# Patient Record
Sex: Male | Born: 1950 | Race: White | Hispanic: No | Marital: Married | Smoking: Current every day smoker
Health system: Southern US, Community
[De-identification: ages and names within clinical notes are randomized; demographics above are authoritative.]

## PROBLEM LIST (undated history)

## (undated) DIAGNOSIS — C801 Malignant (primary) neoplasm, unspecified: Secondary | ICD-10-CM

## (undated) DIAGNOSIS — I10 Essential (primary) hypertension: Secondary | ICD-10-CM

## (undated) DIAGNOSIS — I313 Pericardial effusion (noninflammatory): Secondary | ICD-10-CM

---

## 2002-04-22 ENCOUNTER — Emergency Department (HOSPITAL_COMMUNITY): Admission: EM | Admit: 2002-04-22 | Discharge: 2002-04-22 | Payer: Self-pay | Admitting: Emergency Medicine

## 2002-06-23 ENCOUNTER — Emergency Department (HOSPITAL_COMMUNITY): Admission: EM | Admit: 2002-06-23 | Discharge: 2002-06-23 | Payer: Self-pay | Admitting: Emergency Medicine

## 2010-06-11 ENCOUNTER — Telehealth: Payer: Self-pay | Admitting: *Deleted

## 2010-07-03 ENCOUNTER — Telehealth: Payer: Self-pay | Admitting: *Deleted

## 2010-07-03 NOTE — Telephone Encounter (Signed)
BP readings:  Sat: June 9, 147/90, Sun: 143/78, Mon:  155/88, Tues:  138/89, Wed:  140/96, Th:  155/82, Friday:  162/93.

## 2010-07-03 NOTE — Telephone Encounter (Signed)
Wrong Chart ..........Marland KitchenPlease disregard.

## 2010-07-07 NOTE — Telephone Encounter (Signed)
Debbie please contact [phone] and request to have this document removed from this patient's chart.  Thanks  [place]

## 2010-07-07 NOTE — Telephone Encounter (Signed)
Debbie, please contact [phone] to have this document removed from this(incorrect) patient's chart.  Thanks [place]

## 2012-12-06 ENCOUNTER — Ambulatory Visit: Payer: Self-pay | Admitting: Radiation Oncology

## 2012-12-18 ENCOUNTER — Ambulatory Visit: Payer: Self-pay | Admitting: Radiation Oncology

## 2013-01-08 LAB — CBC CANCER CENTER
Basophil #: 0.1 x10 3/mm (ref 0.0–0.1)
Basophil %: 1.3 %
Eosinophil #: 0.1 x10 3/mm (ref 0.0–0.7)
Eosinophil %: 3 %
HCT: 44.3 % (ref 40.0–52.0)
Lymphocyte #: 0.8 x10 3/mm — ABNORMAL LOW (ref 1.0–3.6)
Lymphocyte %: 20.6 %
MCH: 31.9 pg (ref 26.0–34.0)
Neutrophil #: 2.7 x10 3/mm (ref 1.4–6.5)
Platelet: 109 x10 3/mm — ABNORMAL LOW (ref 150–440)
RBC: 4.69 10*6/uL (ref 4.40–5.90)
RDW: 13.5 % (ref 11.5–14.5)

## 2013-01-15 LAB — CBC CANCER CENTER
Basophil #: 0 x10 3/mm (ref 0.0–0.1)
Basophil %: 1.2 %
HGB: 15.1 g/dL (ref 13.0–18.0)
Lymphocyte #: 0.6 x10 3/mm — ABNORMAL LOW (ref 1.0–3.6)
Lymphocyte %: 18.6 %
MCH: 31.8 pg (ref 26.0–34.0)
MCV: 94 fL (ref 80–100)
Monocyte #: 0.2 x10 3/mm (ref 0.2–1.0)
Monocyte %: 6.5 %
Neutrophil %: 70.3 %
Platelet: 85 x10 3/mm — ABNORMAL LOW (ref 150–440)
RBC: 4.76 10*6/uL (ref 4.40–5.90)
RDW: 13.1 % (ref 11.5–14.5)
WBC: 3.2 x10 3/mm — ABNORMAL LOW (ref 3.8–10.6)

## 2013-01-18 ENCOUNTER — Ambulatory Visit: Payer: Self-pay | Admitting: Radiation Oncology

## 2013-01-22 LAB — CBC CANCER CENTER
BASOS PCT: 1.2 %
Basophil #: 0 x10 3/mm (ref 0.0–0.1)
Eosinophil #: 0.1 x10 3/mm (ref 0.0–0.7)
Eosinophil %: 3.7 %
HCT: 44.5 % (ref 40.0–52.0)
HGB: 15.1 g/dL (ref 13.0–18.0)
LYMPHS ABS: 0.7 x10 3/mm — AB (ref 1.0–3.6)
Lymphocyte %: 17.7 %
MCH: 31.7 pg (ref 26.0–34.0)
MCHC: 33.8 g/dL (ref 32.0–36.0)
MCV: 94 fL (ref 80–100)
MONOS PCT: 7.7 %
Monocyte #: 0.3 x10 3/mm (ref 0.2–1.0)
Neutrophil #: 2.6 x10 3/mm (ref 1.4–6.5)
Neutrophil %: 69.7 %
PLATELETS: 80 x10 3/mm — AB (ref 150–440)
RBC: 4.75 10*6/uL (ref 4.40–5.90)
RDW: 13.4 % (ref 11.5–14.5)
WBC: 3.7 x10 3/mm — AB (ref 3.8–10.6)

## 2013-01-23 LAB — FOLATE: Folic Acid: 6.5 ng/mL (ref 3.1–100.0)

## 2013-01-23 LAB — FIBRINOGEN: Fibrinogen: 365 mg/dL (ref 210–470)

## 2013-01-23 LAB — IRON AND TIBC
IRON BIND. CAP.(TOTAL): 318 ug/dL (ref 250–450)
IRON SATURATION: 45 %
IRON: 143 ug/dL (ref 65–175)
UNBOUND IRON-BIND. CAP.: 175 ug/dL

## 2013-01-23 LAB — LACTATE DEHYDROGENASE: LDH: 159 U/L (ref 85–241)

## 2013-01-23 LAB — APTT: ACTIVATED PTT: 31.1 s (ref 23.6–35.9)

## 2013-01-23 LAB — FERRITIN: FERRITIN (ARMC): 766 ng/mL — AB (ref 8–388)

## 2013-01-23 LAB — PROTIME-INR
INR: 1
PROTHROMBIN TIME: 13.3 s (ref 11.5–14.7)

## 2013-01-23 LAB — RETICULOCYTES
ABSOLUTE RETIC COUNT: 0.1042 10*6/uL (ref 0.019–0.186)
RETICULOCYTE: 2.3 % (ref 0.4–3.1)

## 2013-01-24 LAB — CBC CANCER CENTER
BASOS ABS: 0 x10 3/mm (ref 0.0–0.1)
BASOS PCT: 1.4 %
Basophil: 2 %
COMMENT - H1-COM1: NORMAL
Comment - H1-Com2: NORMAL
EOS PCT: 3.2 %
Eosinophil #: 0.1 x10 3/mm (ref 0.0–0.7)
Eosinophil: 3 %
HCT: 41.9 % (ref 40.0–52.0)
HGB: 14.5 g/dL (ref 13.0–18.0)
LYMPHS ABS: 0.6 x10 3/mm — AB (ref 1.0–3.6)
Lymphocyte %: 17.9 %
Lymphocytes: 24 %
MCH: 32.2 pg (ref 26.0–34.0)
MCHC: 34.7 g/dL (ref 32.0–36.0)
MCV: 93 fL (ref 80–100)
MONO ABS: 0.2 x10 3/mm (ref 0.2–1.0)
MONOS PCT: 6 %
Monocyte %: 6.8 %
NEUTROS ABS: 2.5 x10 3/mm (ref 1.4–6.5)
NEUTROS PCT: 70.7 %
PLATELETS: 83 x10 3/mm — AB (ref 150–440)
RBC: 4.53 10*6/uL (ref 4.40–5.90)
RDW: 13.5 % (ref 11.5–14.5)
Segmented Neutrophils: 65 %
WBC: 3.5 x10 3/mm — AB (ref 3.8–10.6)

## 2013-01-24 LAB — PROT IMMUNOELECTROPHORES(ARMC)

## 2013-01-29 LAB — CBC CANCER CENTER
Basophil #: 0.1 x10 3/mm (ref 0.0–0.1)
Basophil %: 0.7 %
EOS PCT: 1 %
Eosinophil #: 0.1 x10 3/mm (ref 0.0–0.7)
HCT: 41.9 % (ref 40.0–52.0)
HGB: 14.4 g/dL (ref 13.0–18.0)
Lymphocyte #: 0.7 x10 3/mm — ABNORMAL LOW (ref 1.0–3.6)
Lymphocyte %: 10.2 %
MCH: 31.7 pg (ref 26.0–34.0)
MCHC: 34.3 g/dL (ref 32.0–36.0)
MCV: 93 fL (ref 80–100)
MONOS PCT: 5.2 %
Monocyte #: 0.4 x10 3/mm (ref 0.2–1.0)
Neutrophil #: 5.8 x10 3/mm (ref 1.4–6.5)
Neutrophil %: 82.9 %
Platelet: 84 x10 3/mm — ABNORMAL LOW (ref 150–440)
RBC: 4.53 10*6/uL (ref 4.40–5.90)
RDW: 13.5 % (ref 11.5–14.5)
WBC: 6.9 x10 3/mm (ref 3.8–10.6)

## 2013-02-05 LAB — CBC CANCER CENTER
BASOS PCT: 0.7 %
Basophil #: 0 x10 3/mm (ref 0.0–0.1)
EOS ABS: 0 x10 3/mm (ref 0.0–0.7)
Eosinophil %: 0.3 %
HCT: 40.7 % (ref 40.0–52.0)
HGB: 13.9 g/dL (ref 13.0–18.0)
LYMPHS PCT: 7.3 %
Lymphocyte #: 0.4 x10 3/mm — ABNORMAL LOW (ref 1.0–3.6)
MCH: 32.1 pg (ref 26.0–34.0)
MCHC: 34.2 g/dL (ref 32.0–36.0)
MCV: 94 fL (ref 80–100)
MONO ABS: 0.3 x10 3/mm (ref 0.2–1.0)
Monocyte %: 4.4 %
NEUTROS PCT: 87.3 %
Neutrophil #: 5.4 x10 3/mm (ref 1.4–6.5)
PLATELETS: 105 x10 3/mm — AB (ref 150–440)
RBC: 4.34 10*6/uL — ABNORMAL LOW (ref 4.40–5.90)
RDW: 13.7 % (ref 11.5–14.5)
WBC: 6.2 x10 3/mm (ref 3.8–10.6)

## 2013-02-05 LAB — GLUCOSE, RANDOM: GLUCOSE: 121 mg/dL — AB (ref 65–99)

## 2013-02-12 LAB — CBC CANCER CENTER
BASOS ABS: 0 x10 3/mm (ref 0.0–0.1)
BASOS PCT: 0.4 %
EOS ABS: 0 x10 3/mm (ref 0.0–0.7)
EOS PCT: 0.2 %
HCT: 41.7 % (ref 40.0–52.0)
HGB: 14.1 g/dL (ref 13.0–18.0)
LYMPHS PCT: 5.2 %
Lymphocyte #: 0.4 x10 3/mm — ABNORMAL LOW (ref 1.0–3.6)
MCH: 32.3 pg (ref 26.0–34.0)
MCHC: 33.8 g/dL (ref 32.0–36.0)
MCV: 96 fL (ref 80–100)
MONO ABS: 0.3 x10 3/mm (ref 0.2–1.0)
Monocyte %: 3.3 %
NEUTROS PCT: 90.9 %
Neutrophil #: 7 x10 3/mm — ABNORMAL HIGH (ref 1.4–6.5)
PLATELETS: 100 x10 3/mm — AB (ref 150–440)
RBC: 4.37 10*6/uL — AB (ref 4.40–5.90)
RDW: 14.6 % — ABNORMAL HIGH (ref 11.5–14.5)
WBC: 7.7 x10 3/mm (ref 3.8–10.6)

## 2013-02-12 LAB — GLUCOSE, RANDOM: GLUCOSE: 130 mg/dL — AB (ref 65–99)

## 2013-02-18 ENCOUNTER — Ambulatory Visit: Payer: Self-pay | Admitting: Radiation Oncology

## 2013-02-19 LAB — CBC CANCER CENTER
Basophil #: 0.1 x10 3/mm (ref 0.0–0.1)
Basophil %: 1 %
EOS PCT: 1.2 %
Eosinophil #: 0.1 x10 3/mm (ref 0.0–0.7)
HCT: 38.6 % — ABNORMAL LOW (ref 40.0–52.0)
HGB: 13 g/dL (ref 13.0–18.0)
LYMPHS PCT: 12.6 %
Lymphocyte #: 0.6 x10 3/mm — ABNORMAL LOW (ref 1.0–3.6)
MCH: 32.5 pg (ref 26.0–34.0)
MCHC: 33.7 g/dL (ref 32.0–36.0)
MCV: 96 fL (ref 80–100)
MONOS PCT: 7.7 %
Monocyte #: 0.4 x10 3/mm (ref 0.2–1.0)
Neutrophil #: 3.9 x10 3/mm (ref 1.4–6.5)
Neutrophil %: 77.5 %
Platelet: 82 x10 3/mm — ABNORMAL LOW (ref 150–440)
RBC: 4.01 10*6/uL — ABNORMAL LOW (ref 4.40–5.90)
RDW: 14.7 % — ABNORMAL HIGH (ref 11.5–14.5)
WBC: 5 x10 3/mm (ref 3.8–10.6)

## 2013-02-19 LAB — BASIC METABOLIC PANEL
ANION GAP: 9 (ref 7–16)
BUN: 20 mg/dL — ABNORMAL HIGH (ref 7–18)
CALCIUM: 8.9 mg/dL (ref 8.5–10.1)
CO2: 26 mmol/L (ref 21–32)
CREATININE: 1.11 mg/dL (ref 0.60–1.30)
Chloride: 100 mmol/L (ref 98–107)
EGFR (African American): >60
EGFR (Non-African Amer.): >60
Glucose: 90 mg/dL (ref 65–99)
Osmolality: 272 (ref 275–301)
POTASSIUM: 3.7 mmol/L (ref 3.5–5.1)
SODIUM: 135 mmol/L — AB (ref 136–145)

## 2013-02-19 LAB — HEPATIC FUNCTION PANEL A (ARMC)
ALK PHOS: 91 U/L
Albumin: 3.3 g/dL — ABNORMAL LOW (ref 3.4–5.0)
BILIRUBIN DIRECT: 0.1 mg/dL (ref 0.00–0.20)
Bilirubin,Total: 0.4 mg/dL (ref 0.2–1.0)
SGOT(AST): 22 U/L (ref 15–37)
SGPT (ALT): 52 U/L (ref 12–78)
TOTAL PROTEIN: 6.6 g/dL (ref 6.4–8.2)

## 2013-03-05 LAB — CBC CANCER CENTER
BASOS ABS: 0 x10 3/mm (ref 0.0–0.1)
Basophil %: 1.1 %
EOS ABS: 0.1 x10 3/mm (ref 0.0–0.7)
Eosinophil %: 1.5 %
HCT: 37.4 % — AB (ref 40.0–52.0)
HGB: 12.8 g/dL — ABNORMAL LOW (ref 13.0–18.0)
Lymphocyte #: 0.5 x10 3/mm — ABNORMAL LOW (ref 1.0–3.6)
Lymphocyte %: 13.2 %
MCH: 32.6 pg (ref 26.0–34.0)
MCHC: 34.1 g/dL (ref 32.0–36.0)
MCV: 95 fL (ref 80–100)
MONOS PCT: 5.4 %
Monocyte #: 0.2 x10 3/mm (ref 0.2–1.0)
NEUTROS PCT: 78.8 %
Neutrophil #: 2.8 x10 3/mm (ref 1.4–6.5)
PLATELETS: 98 x10 3/mm — AB (ref 150–440)
RBC: 3.92 10*6/uL — AB (ref 4.40–5.90)
RDW: 15.3 % — AB (ref 11.5–14.5)
WBC: 3.6 x10 3/mm — AB (ref 3.8–10.6)

## 2013-03-05 LAB — GLUCOSE, RANDOM: Glucose: 131 mg/dL — ABNORMAL HIGH (ref 65–99)

## 2013-03-13 ENCOUNTER — Ambulatory Visit: Payer: Self-pay | Admitting: Urology

## 2013-03-13 LAB — HEMOGLOBIN: HGB: 13.4 g/dL (ref 13.0–18.0)

## 2013-03-18 ENCOUNTER — Ambulatory Visit: Payer: Self-pay | Admitting: Radiation Oncology

## 2013-03-19 LAB — CBC CANCER CENTER
BASOS PCT: 1 %
Basophil #: 0 x10 3/mm (ref 0.0–0.1)
EOS ABS: 0.1 x10 3/mm (ref 0.0–0.7)
Eosinophil %: 1.9 %
HCT: 38 % — ABNORMAL LOW (ref 40.0–52.0)
HGB: 13 g/dL (ref 13.0–18.0)
LYMPHS PCT: 14.9 %
Lymphocyte #: 0.6 x10 3/mm — ABNORMAL LOW (ref 1.0–3.6)
MCH: 32.8 pg (ref 26.0–34.0)
MCHC: 34.1 g/dL (ref 32.0–36.0)
MCV: 96 fL (ref 80–100)
Monocyte #: 0.3 x10 3/mm (ref 0.2–1.0)
Monocyte %: 7.4 %
NEUTROS PCT: 74.8 %
Neutrophil #: 2.9 x10 3/mm (ref 1.4–6.5)
Platelet: 104 x10 3/mm — ABNORMAL LOW (ref 150–440)
RBC: 3.95 10*6/uL — ABNORMAL LOW (ref 4.40–5.90)
RDW: 15.7 % — AB (ref 11.5–14.5)
WBC: 3.9 x10 3/mm (ref 3.8–10.6)

## 2013-03-19 LAB — GLUCOSE, RANDOM: GLUCOSE: 119 mg/dL — AB (ref 65–99)

## 2013-03-21 ENCOUNTER — Ambulatory Visit: Payer: Self-pay | Admitting: Urology

## 2013-04-02 LAB — CBC CANCER CENTER
Basophil #: 0.1 x10 3/mm (ref 0.0–0.1)
Basophil %: 1.5 %
EOS ABS: 0.1 x10 3/mm (ref 0.0–0.7)
Eosinophil %: 2.6 %
HCT: 38.9 % — ABNORMAL LOW (ref 40.0–52.0)
HGB: 13.1 g/dL (ref 13.0–18.0)
Lymphocyte #: 0.6 x10 3/mm — ABNORMAL LOW (ref 1.0–3.6)
Lymphocyte %: 17.6 %
MCH: 33 pg (ref 26.0–34.0)
MCHC: 33.8 g/dL (ref 32.0–36.0)
MCV: 98 fL (ref 80–100)
MONO ABS: 0.2 x10 3/mm (ref 0.2–1.0)
Monocyte %: 6.3 %
NEUTROS ABS: 2.6 x10 3/mm (ref 1.4–6.5)
NEUTROS PCT: 72 %
Platelet: 111 x10 3/mm — ABNORMAL LOW (ref 150–440)
RBC: 3.98 10*6/uL — AB (ref 4.40–5.90)
RDW: 15.1 % — AB (ref 11.5–14.5)
WBC: 3.7 x10 3/mm — AB (ref 3.8–10.6)

## 2013-04-02 LAB — GLUCOSE, RANDOM: GLUCOSE: 108 mg/dL — AB (ref 65–99)

## 2013-04-16 LAB — CBC CANCER CENTER
BASOS ABS: 0.1 x10 3/mm (ref 0.0–0.1)
Basophil %: 1.2 %
Eosinophil #: 0.1 x10 3/mm (ref 0.0–0.7)
Eosinophil %: 2.3 %
HCT: 38.8 % — ABNORMAL LOW (ref 40.0–52.0)
HGB: 13.2 g/dL (ref 13.0–18.0)
LYMPHS PCT: 13.4 %
Lymphocyte #: 0.6 x10 3/mm — ABNORMAL LOW (ref 1.0–3.6)
MCH: 33.1 pg (ref 26.0–34.0)
MCHC: 33.9 g/dL (ref 32.0–36.0)
MCV: 98 fL (ref 80–100)
MONO ABS: 0.3 x10 3/mm (ref 0.2–1.0)
Monocyte %: 6.9 %
NEUTROS PCT: 76.2 %
Neutrophil #: 3.3 x10 3/mm (ref 1.4–6.5)
Platelet: 102 x10 3/mm — ABNORMAL LOW (ref 150–440)
RBC: 3.97 10*6/uL — AB (ref 4.40–5.90)
RDW: 14.5 % (ref 11.5–14.5)
WBC: 4.3 x10 3/mm (ref 3.8–10.6)

## 2013-04-16 LAB — GLUCOSE, RANDOM: Glucose: 109 mg/dL — ABNORMAL HIGH (ref 65–99)

## 2013-04-18 ENCOUNTER — Ambulatory Visit: Payer: Self-pay | Admitting: Radiation Oncology

## 2013-05-18 ENCOUNTER — Ambulatory Visit: Payer: Self-pay | Admitting: Radiation Oncology

## 2013-08-24 ENCOUNTER — Ambulatory Visit: Payer: Self-pay | Admitting: Radiation Oncology

## 2013-08-30 LAB — PSA

## 2013-09-18 ENCOUNTER — Ambulatory Visit: Payer: Self-pay | Admitting: Radiation Oncology

## 2014-02-25 ENCOUNTER — Ambulatory Visit: Payer: Self-pay | Admitting: Internal Medicine

## 2014-03-19 ENCOUNTER — Ambulatory Visit
Admit: 2014-03-19 | Disposition: A | Discharge status: Home / Self Care | Payer: Self-pay | Attending: Internal Medicine | Admitting: Internal Medicine

## 2014-05-10 NOTE — Consult Note (Signed)
Reason for Visit: This 64 year old Male [name] [name] presents to the clinic for initial evaluation of  prostate cancer .   Referred by Dr. Bernardo Heater.  Diagnosis:  Chief Complaint/Diagnosis   64 year old male presented with a PSA in the 27 range status post transrectal ultrasound-guided biopsy positive for Gleason7 (4+3) bone scan negative staged as a clinical stage IIB (T1 C. N0 M0) with a PSA greater than 20 to receive both external beam IMRT radiation therapy to prostate and pelvic nodes, I-125 interstitial implant, and one half years of Lupron suppression  Pathology Report pathology report reviewed   Imaging Report CT scans and bone scans requested for my review   Referral Report clinical notes reviewed   Planned Treatment Regimen IMRT radiation to prostate and pelvic nodes plus I-125 interstitial implant for boost and one half years of Lupron suppression   HPI   [name] [name] is a 64 year old male presented with PMD and found to have a PSA over 20. This was first detected in July of 2014 climbed to 28.5 in August of 2014. Was seen by Dr. Bernardo Heater and underwent transrectal ultrasound-guided biopsy showing 3 of 12 cores positive for Gleason 7 adenocarcinoma. One of the cores had Gleason 4+3. He is fairly asymptomatic has nocturia x1-2 no specific frequency or urgency. He had a bone scan which showed no evidence of metastatic disease. [name] [name] also had a CT scan showing no clinical evidence of pelvic adenopathy. He is seen at [facility] and is now referred to radiation oncology for consideration of treatment.  Past Hx:    Prostate Cancer:    Hypertension:    Arthritis:    arthritis:    Anxiety:   Past, Family and Social History:  Past Medical History positive   Cardiovascular hypertension   Neurological/Psychiatric anxiety   Past Medical History Comments arthritis   Family History positive   Family History Comments family history positive for adult onset diabetes and arthritis.    Social History positive   Social History Comments [name] [name] greater than 40-pack-year smoking history has discontinued smoking. Also drinks daily.   Additional Past Medical and Surgical History accompanied today by his wife   Allergies:   No Known Allergies:   Home Meds:  Home Medications: Medication Instructions Status  clonazePAM 1 mg oral tablet 1 tab(s) orally 3 times a day Active  primidone 50 mg oral tablet 1 tab(s) orally 3 times a day Active  tamsulosin 0.4 mg oral capsule 1 cap(s) orally once a day Active  bicalutamide 50 mg oral tablet 1 tab(s) orally every 24 hours Active  traMADol 50 mg oral tablet 1 tab(s) orally every 6 hours, As Needed - for Pain Active   Review of Systems:  General negative   Performance Status (ECOG) 0   Skin negative   Breast negative   Ophthalmologic negative   ENMT negative   Respiratory and Thorax negative   Cardiovascular negative   Gastrointestinal negative   Genitourinary see HPI   Musculoskeletal negative   Neurological negative   Psychiatric negative   Hematology/Lymphatics negative   Endocrine negative   Allergic/Immunologic negative   Review of Systems   review of systems obtained from nurse's notes  Nursing Notes:  Nursing Vital Signs and Chemo Nursing Nursing Notes: *CC Vital Signs Flowsheet:   19-Nov-14 15:05  Temp Temperature 98  Pulse Pulse 88  Respirations Respirations 19  SBP SBP 177  DBP DBP 92  Current Weight (kg) (kg) 101.3   Physical Exam:  General/Skin/HEENT:  General  normal   Skin normal   Eyes normal   ENMT normal   Head and Neck normal   Additional PE well-developed well-nourished male in NAD. Teeth are in poor state of repair. Neck is clear without evidence of cervical or supraclavicular adenopathy. Lungs are clear to A&P cardiac examination shows regular rate and rhythm. Abdomen is benign. On rectal exam rectal sphincter tone is good prostate is smooth without evidence of mass or  nodularity. Sulcus is preserved bilaterally. No other rectal abnormalities identified.   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 I would like to go ahead with IMRT radiation therapy to both his pelvic nodes and prostate up to 5000 cGy. I would then boost using I-125 interstitial implant another 100 gray to his prostatic volume. Also agree with Lupron suppression for your half and [name] [name] is already been started on that treatment. Risks and benefits of treatment including increased diarrhea, possible increased frequency urgency of urination, fatigue, were explained in detail to the [name] [name]. I have set him up for CT simulation in about a week's time. We'll also set him up for a volume study during treatments in preparation for I-125 interstitial implant to be used as a boost.  I would like to take this opportunity to thank you for allowing me to continue to participate in this [name] [name]'s care.  CC Referral:  cc: Dr. Bernardo Heater, Dr. [name]   Electronic Signatures: Baruch Gouty, Roda Shutters (MD)  (Signed [id number] 15:54)  Authored: HPI, Diagnosis, Past Hx, PFSH, Allergies, Home Meds, ROS, Nursing Notes, Physical Exam, Relevent Results, Encounter Assessment and Plan, CC Referring Physician   Last Updated: 19-Nov-14 15:54 by Armstead Peaks (MD)

## 2014-05-11 NOTE — Op Note (Signed)
PATIENT NAME:  [name] [name], [name] [name] MR#:  [id number] DATE OF BIRTH:  Aug 05, 1950  DATE OF SURGERY:  03/21/2013  PREOPERATIVE DIAGNOSIS: Clinical stage T1C adenocarcinoma of the prostate, moderate risk.  POSTOPERATIVE DIAGNOSIS: Clinical stage T1C adenocarcinoma of the prostate, moderate risk.  PROCEDURE:  1.  Prostate brachytherapy.  2.  Cystoscopy.   SURGEON: John Giovanni, M.D.   RADIATION ONCOLOGIST: Dr. Baruch Gouty  ANESTHESIA: General.   INDICATION: A 64 year old male with a PSA of 27 and biopsy positive for Gleason 4 + 3 adenocarcinoma of the prostate. Metastatic evaluation was negative. He received external beam IMRT of the prostate and pelvic nodes, and presents today for I-125 seed implant boost to the prostate. He recently underwent a brachytherapy planning study.   DESCRIPTION OF PROCEDURE: The patient was taken to the Operating Room and placed on the table in the supine position. A general anesthetic was administered via an LMA. Time out was performed per protocol, with all in agreement. He was placed in the low lithotomy position with his positioning to match that obtained during the planning study. His external genitalia and perineum were prepped and draped in the usual fashion. A 16 French Foley catheter was placed without difficulty on the operative field. A 7.5 megahertz transrectal ultrasound probe was inserted per rectum and placed in the stepping unit. Prostatic image was positioned to match that obtained during the brachytherapy planning study. A perineal template was then attached to the stepping unit. Preloaded needles were then passed under ultrasound guidance to coordinates as determined by the brachytherapy planning study. Needle location was verified in both transverse and sagittal ultrasonic views. Then 17 needles were passed, with a total number of 53 seeds. At completion of the procedure, the ultrasound probe was removed. Foley catheter was removed. A 21 French cystoscope was  passed under direct vision. The urethra was normal in caliber without stricture. Prostate shows mild lateral lobe enlargement and moderate bladder neck elevation. Bladder mucosa was normal in appearance. The ureteral orifices were normal-appearing, with clear efflux. No seeds were noted within the bladder or prostatic urethra. Cystoscope was removed. Foley catheter was replaced to gravity drainage. The patient was taken to the PACU in stable condition. There were no complications. Estimated blood loss minimal.     ____________________________ Ronda Fairly. Bernardo Heater, MD scs:mr D: 03/21/2013 12:34:26 ET T: 03/21/2013 19:12:17 ET JOB#: [id number]  cc: Nicki Reaper C. Bernardo Heater, MD, <Dictator> Abbie Sons MD ELECTRONICALLY SIGNED 04/04/2013 10:01

## 2014-05-11 NOTE — H&P (Signed)
PATIENT NAME:  [name] [name], [name] [name] MR#:  [id number] DATE OF BIRTH:  [birth year]  DATE OF ADMISSION:  03/13/2013  HISTORY OF PRESENT ILLNESS: The patient is a 64 year old male, who presented with a PSA in the 27 range, status post transrectal ultrasound-guided biopsy positive for Gleason 7 (4 + 3) adenocarcinoma. Bone scan was negative. He was clinically staged IIb (T1C, N0, M0). Recommendation was made for both external beam IMRT radiation to his prostate and pelvic nodes and boost with an I-125 seed implant. He is seen today for volume study. He is fairly asymptomatic, has nocturia x 1 to 2. No urinary incontinence. He does have a significant alcohol history. Has been seen by hematology for thrombocytopenia, which responded well initially to steroid therapy.   PAST MEDICAL HISTORY:  1.  Prostate cancer.  2.  Hypertension.  3.  Arthritis.  4.  Anxiety.   FAMILY HISTORY: Father with adult onset diabetes and arthritis.   SOCIAL HISTORY: Greater than 40 pack-year smoking history has discontinued smoking. Also drinks daily and has a long EtOH abuse history.   ALLERGIES: No known medical allergies.   HOME MEDICATIONS:  1.  Clonazepam 1 mg oral tablet, 1 tablet 3 times a day. 2.  Primidone 50 mg oral tablet 1 tablet orally 3 times a day.  3.  Tamsulosin 0.4 mg oral capsules, 1 capsule orally once a day.  4.  Bicalutamide 50 mg oral tablet, 1 tablet once a day.  5.  Tramadol 50 mg oral tablet as needed for pain every 6 hours.   REVIEW OF SYSTEMS: Unchanged from prior examination dated December 06, 2012.   PHYSICAL EXAMINATION:  VITAL SIGNS: Stable. Temperature 98, pulse 88, respirations 19 per minute, blood pressure 177/92.  GENERAL: Well-developed, well-nourished, slightly obese male in NAD. Has a slight intentional tremor.  HEENT: PERRLA, EOMI, disks not visualized.  NECK: Clear without evidence of cervical or supraclavicular adenopathy.  LUNGS: Clear to A and P.  HEART: Regular rate and  rhythm.  ABDOMEN: Benign with no organomegaly or masses noted.  NEUROLOGIC: Motor, sensory, and DTR levels are equal and symmetric in the upper and lower extremities. No peripheral adenopathy or edema is identified.   IMPRESSION: Stage IIb adenocarcinoma of the prostate, Gleason score of 7 (4 + 3) undergoing external beam IMRT radiation therapy to his prostate and pelvic nodes.   At this time, the patient is cleared to go ahead with I-125 interstitial implant. Please see OR note. He had volume study performed today. The risks and benefits of the procedure were reviewed with the patient and consent was signed. The patient is already scheduled for his seed implantation.   Thank you for the opportunity for allowing me to continue to participate in his care.  ____________________________ Armstead Peaks, MD gsc:aw D: 02/28/2013 13:55:08 ET T: 02/28/2013 14:03:23 ET JOB#: [id number]  cc: Armstead Peaks, MD, <Dictator> Armstead Peaks MD ELECTRONICALLY SIGNED 04/03/2013 10:26

## 2015-02-24 ENCOUNTER — Ambulatory Visit: Payer: Medicare HMO | Attending: Radiation Oncology | Admitting: Radiation Oncology

## 2015-10-24 ENCOUNTER — Telehealth: Payer: Self-pay | Admitting: *Deleted

## 2015-10-24 NOTE — Telephone Encounter (Signed)
Patients wife called asking if Dr. Baruch Gouty had done any genetic testing on her husband.

## 2015-10-27 NOTE — Telephone Encounter (Signed)
Patient was notify the genetic testing is not normally performed for prostate cancer

## 2017-08-04 ENCOUNTER — Emergency Department (HOSPITAL_COMMUNITY): Payer: Medicare HMO

## 2017-08-04 ENCOUNTER — Other Ambulatory Visit: Payer: Self-pay

## 2017-08-04 ENCOUNTER — Encounter (HOSPITAL_COMMUNITY): Payer: Self-pay | Admitting: Emergency Medicine

## 2017-08-04 ENCOUNTER — Inpatient Hospital Stay (HOSPITAL_COMMUNITY)
Admission: EM | Admit: 2017-08-04 | Discharge: 2017-08-12 | DRG: 270 | Disposition: A | Discharge status: Hospice | Payer: Medicare HMO | Attending: Internal Medicine | Admitting: Internal Medicine

## 2017-08-04 DIAGNOSIS — E860 Dehydration: Secondary | ICD-10-CM | POA: Diagnosis present

## 2017-08-04 DIAGNOSIS — R569 Unspecified convulsions: Secondary | ICD-10-CM | POA: Diagnosis not present

## 2017-08-04 DIAGNOSIS — R0981 Nasal congestion: Secondary | ICD-10-CM | POA: Diagnosis present

## 2017-08-04 DIAGNOSIS — E878 Other disorders of electrolyte and fluid balance, not elsewhere classified: Secondary | ICD-10-CM

## 2017-08-04 DIAGNOSIS — I314 Cardiac tamponade: Secondary | ICD-10-CM | POA: Diagnosis present

## 2017-08-04 DIAGNOSIS — I10 Essential (primary) hypertension: Secondary | ICD-10-CM

## 2017-08-04 DIAGNOSIS — C61 Malignant neoplasm of prostate: Secondary | ICD-10-CM | POA: Diagnosis present

## 2017-08-04 DIAGNOSIS — G250 Essential tremor: Secondary | ICD-10-CM | POA: Diagnosis present

## 2017-08-04 DIAGNOSIS — C799 Secondary malignant neoplasm of unspecified site: Secondary | ICD-10-CM | POA: Diagnosis present

## 2017-08-04 DIAGNOSIS — K567 Ileus, unspecified: Secondary | ICD-10-CM | POA: Diagnosis present

## 2017-08-04 DIAGNOSIS — I129 Hypertensive chronic kidney disease with stage 1 through stage 4 chronic kidney disease, or unspecified chronic kidney disease: Secondary | ICD-10-CM | POA: Diagnosis present

## 2017-08-04 DIAGNOSIS — Z8546 Personal history of malignant neoplasm of prostate: Secondary | ICD-10-CM

## 2017-08-04 DIAGNOSIS — F329 Major depressive disorder, single episode, unspecified: Secondary | ICD-10-CM | POA: Diagnosis present

## 2017-08-04 DIAGNOSIS — R911 Solitary pulmonary nodule: Secondary | ICD-10-CM | POA: Diagnosis present

## 2017-08-04 DIAGNOSIS — I313 Pericardial effusion (noninflammatory): Secondary | ICD-10-CM | POA: Diagnosis present

## 2017-08-04 DIAGNOSIS — I3139 Other pericardial effusion (noninflammatory): Secondary | ICD-10-CM | POA: Diagnosis present

## 2017-08-04 DIAGNOSIS — D631 Anemia in chronic kidney disease: Secondary | ICD-10-CM | POA: Diagnosis present

## 2017-08-04 DIAGNOSIS — R188 Other ascites: Secondary | ICD-10-CM | POA: Diagnosis present

## 2017-08-04 DIAGNOSIS — E872 Acidosis, unspecified: Secondary | ICD-10-CM

## 2017-08-04 DIAGNOSIS — N183 Chronic kidney disease, stage 3 (moderate): Secondary | ICD-10-CM | POA: Diagnosis present

## 2017-08-04 DIAGNOSIS — Z09 Encounter for follow-up examination after completed treatment for conditions other than malignant neoplasm: Secondary | ICD-10-CM

## 2017-08-04 DIAGNOSIS — E871 Hypo-osmolality and hyponatremia: Secondary | ICD-10-CM

## 2017-08-04 DIAGNOSIS — F17210 Nicotine dependence, cigarettes, uncomplicated: Secondary | ICD-10-CM | POA: Diagnosis present

## 2017-08-04 DIAGNOSIS — R112 Nausea with vomiting, unspecified: Secondary | ICD-10-CM

## 2017-08-04 DIAGNOSIS — J189 Pneumonia, unspecified organism: Secondary | ICD-10-CM | POA: Diagnosis present

## 2017-08-04 DIAGNOSIS — Z515 Encounter for palliative care: Secondary | ICD-10-CM | POA: Diagnosis present

## 2017-08-04 DIAGNOSIS — Z9889 Other specified postprocedural states: Secondary | ICD-10-CM

## 2017-08-04 DIAGNOSIS — F1010 Alcohol abuse, uncomplicated: Secondary | ICD-10-CM | POA: Diagnosis present

## 2017-08-04 DIAGNOSIS — Z923 Personal history of irradiation: Secondary | ICD-10-CM

## 2017-08-04 DIAGNOSIS — E876 Hypokalemia: Secondary | ICD-10-CM | POA: Diagnosis not present

## 2017-08-04 DIAGNOSIS — J90 Pleural effusion, not elsewhere classified: Secondary | ICD-10-CM | POA: Diagnosis present

## 2017-08-04 DIAGNOSIS — Z66 Do not resuscitate: Secondary | ICD-10-CM | POA: Diagnosis present

## 2017-08-04 DIAGNOSIS — I959 Hypotension, unspecified: Secondary | ICD-10-CM | POA: Diagnosis present

## 2017-08-04 DIAGNOSIS — I480 Paroxysmal atrial fibrillation: Secondary | ICD-10-CM | POA: Diagnosis present

## 2017-08-04 DIAGNOSIS — N179 Acute kidney failure, unspecified: Secondary | ICD-10-CM | POA: Diagnosis present

## 2017-08-04 DIAGNOSIS — F419 Anxiety disorder, unspecified: Secondary | ICD-10-CM | POA: Diagnosis present

## 2017-08-04 HISTORY — DX: Malignant (primary) neoplasm, unspecified: C80.1

## 2017-08-04 HISTORY — DX: Essential (primary) hypertension: I10

## 2017-08-04 HISTORY — DX: Pericardial effusion (noninflammatory): I31.3

## 2017-08-04 LAB — COMPREHENSIVE METABOLIC PANEL
ALT: 20 U/L (ref 0–44)
ANION GAP: 17 — AB (ref 5–15)
AST: 42 U/L — ABNORMAL HIGH (ref 15–41)
Albumin: 2.9 g/dL — ABNORMAL LOW (ref 3.5–5.0)
Alkaline Phosphatase: 262 U/L — ABNORMAL HIGH (ref 38–126)
BILIRUBIN TOTAL: 1.6 mg/dL — AB (ref 0.3–1.2)
BUN: 54 mg/dL — ABNORMAL HIGH (ref 8–23)
CALCIUM: 8.7 mg/dL — AB (ref 8.9–10.3)
CO2: 21 mmol/L — ABNORMAL LOW (ref 22–32)
Chloride: 87 mmol/L — ABNORMAL LOW (ref 98–111)
Creatinine, Ser: 2.16 mg/dL — ABNORMAL HIGH (ref 0.61–1.24)
GFR, EST AFRICAN AMERICAN: 35 mL/min — AB (ref >60)
GFR, EST NON AFRICAN AMERICAN: 30 mL/min — AB (ref >60)
GLUCOSE: 117 mg/dL — AB (ref 70–99)
POTASSIUM: 3.1 mmol/L — AB (ref 3.5–5.1)
Sodium: 125 mmol/L — ABNORMAL LOW (ref 135–145)
TOTAL PROTEIN: 7.3 g/dL (ref 6.5–8.1)

## 2017-08-04 LAB — CBC
HCT: 37 % — ABNORMAL LOW (ref 39.0–52.0)
Hemoglobin: 12.6 g/dL — ABNORMAL LOW (ref 13.0–17.0)
MCH: 28.8 pg (ref 26.0–34.0)
MCHC: 34.1 g/dL (ref 30.0–36.0)
MCV: 84.5 fL (ref 78.0–100.0)
PLATELETS: 236 10*3/uL (ref 150–400)
RBC: 4.38 MIL/uL (ref 4.22–5.81)
RDW: 14.2 % (ref 11.5–15.5)
WBC: 13.8 10*3/uL — AB (ref 4.0–10.5)

## 2017-08-04 LAB — AMMONIA: AMMONIA: 10 umol/L (ref 9–35)

## 2017-08-04 LAB — CBG MONITORING, ED: Glucose-Capillary: 120 mg/dL — ABNORMAL HIGH (ref 70–99)

## 2017-08-04 LAB — ETHANOL

## 2017-08-04 LAB — TROPONIN I

## 2017-08-04 NOTE — ED Triage Notes (Signed)
Pt C/O nasal congestion that started a few weeks ago. Pt states he tried taking OTC medications with no relief. Pt denies N/V.

## 2017-08-04 NOTE — ED Provider Notes (Signed)
[facility] EMERGENCY DEPARTMENT Provider Note   CSN: [id number] Arrival date & time: 08/04/17  2125  Time seen 23:10 PM   History   Chief Complaint Chief Complaint  Patient presents with  . Weakness    generalized   Level 5 caveat due to low health literacy  HPI [name] [name] is a 67 y.o. male.  HPI patient states he is here because "my sinuses are plugged".  When I asked him to tell me what symptoms he is having he states he cannot.  He does state he is losing his equilibrium "for a while", he cannot tell me how long that is but may be a few weeks.  When asked if he always falls towards the same side he states it varies.  He also states he "cannot eat" when asked why he states he is full of sh*t", he cannot tell me when his last bowel movement was although he states he had some liquid yesterday.  Patient denies headache, nausea, vomiting, fever, cough, rhinorrhea, sneezing, numbness or tingling of his extremities.  He denies abdominal or chest pain.  PCP [name], MD   Past Medical History:  Diagnosis Date  . Cancer [facility])    Prostate cancer  . Hypertension     There are no active problems to display for this patient.   History reviewed. No pertinent surgical history.      Home Medications    Prior to Admission medications   Medication Sig Start Date End Date Taking? Authorizing Provider  amLODipine (NORVASC) 5 MG tablet Take 5 mg by mouth daily. 07/20/17  Yes [provider]  clonazePAM (KLONOPIN) 1 MG tablet Take 1 mg by mouth 3 (three) times daily. 05/09/17  Yes [provider]  cloNIDine (CATAPRES) 0.1 MG tablet Take 0.1 mg by mouth 2 (two) times daily. 07/27/17  Yes [provider]  gabapentin (NEURONTIN) 300 MG capsule Take 300 mg by mouth 2 (two) times daily. 06/20/17  Yes [provider]  oxybutynin (DITROPAN) 5 MG tablet Take 5 mg by mouth 2 (two) times daily.  01/21/17  Yes [provider]  sertraline (ZOLOFT) 100  MG tablet Take 100 mg by mouth daily. 06/15/17  Yes [provider]  tamsulosin (FLOMAX) 0.4 MG CAPS capsule Take 0.4 mg by mouth daily.  07/27/16  Yes [provider]  topiramate (TOPAMAX) 100 MG tablet Take 100 mg by mouth 2 (two) times daily. 06/27/17  Yes [provider]    Family History No family history on file.  Social History Social History   Tobacco Use  . Smoking status: Current Every Day Smoker    Packs/day: 1.00  . Smokeless tobacco: Never Used  Substance Use Topics  . Alcohol use: Not Currently  . Drug use: Never  lives alone Denies using a cane or walker Cannot tell me when he drank last   Allergies   Bee venom   Review of Systems Review of Systems  Unable to perform ROS: Other     Physical Exam Updated Vital Signs BP (!) 123/99   Pulse 89   Temp 97.8 F (36.6 C) (Oral)   Resp 14   Ht 5\' 8"  (1.727 m)   Wt 68 kg (150 lb)   SpO2 100%   BMI 22.81 kg/m   Vital signs normal    Physical Exam  Constitutional: He is oriented to person, place, and time.  Non-toxic appearance. He does not appear ill. No distress.  Thin male who looks  older than his stated age, he has sunken in areas in his temples from muscle wasting  HENT:  Head: Normocephalic and atraumatic.  Right Ear: External ear normal.  Left Ear: External ear normal.  Nose: Nose normal. No mucosal edema or rhinorrhea.  Mouth/Throat: Mucous membranes are dry. No dental abscesses or uvula swelling.  Eyes: Pupils are equal, round, and reactive to light. Conjunctivae and EOM are normal.  Neck: Normal range of motion and full passive range of motion without pain. Neck supple.  Cardiovascular: Normal rate, regular rhythm and normal heart sounds. Exam reveals no gallop and no friction rub.  No murmur heard. Pulmonary/Chest: Effort normal and breath sounds normal. No respiratory distress. He has no wheezes. He has no rhonchi. He has no rales. He exhibits no tenderness and no  crepitus.  Abdominal: Soft. Normal appearance and bowel sounds are normal. He exhibits no distension. There is no tenderness. There is no rebound and no guarding.  Musculoskeletal: Normal range of motion. He exhibits no edema or tenderness.  Moves all extremities well.   Neurological: He is alert and oriented to person, place, and time. He has normal strength. No cranial nerve deficit.  Skin: Skin is warm, dry and intact. No rash noted. No erythema. There is pallor.  Psychiatric: He has a normal mood and affect. His speech is normal and behavior is normal. His mood appears not anxious.  Nursing note and vitals reviewed.    ED Treatments / Results  Labs (all labs ordered are listed, but only abnormal results are displayed) Results for orders placed or performed during the hospital encounter of 08/04/17  CBC  Result Value Ref Range   WBC 13.8 (H) 4.0 - 10.5 K/uL   RBC 4.38 4.22 - 5.81 MIL/uL   Hemoglobin 12.6 (L) 13.0 - 17.0 g/dL   HCT 37.0 (L) 39.0 - 52.0 %   MCV 84.5 78.0 - 100.0 fL   MCH 28.8 26.0 - 34.0 pg   MCHC 34.1 30.0 - 36.0 g/dL   RDW 14.2 11.5 - 15.5 %   Platelets 236 150 - 400 K/uL  Comprehensive metabolic panel  Result Value Ref Range   Sodium 125 (L) 135 - 145 mmol/L   Potassium 3.1 (L) 3.5 - 5.1 mmol/L   Chloride 87 (L) 98 - 111 mmol/L   CO2 21 (L) 22 - 32 mmol/L   Glucose, Bld 117 (H) 70 - 99 mg/dL   BUN 54 (H) 8 - 23 mg/dL   Creatinine, Ser 2.16 (H) 0.61 - 1.24 mg/dL   Calcium 8.7 (L) 8.9 - 10.3 mg/dL   Total Protein 7.3 6.5 - 8.1 g/dL   Albumin 2.9 (L) 3.5 - 5.0 g/dL   AST 42 (H) 15 - 41 U/L   ALT 20 0 - 44 U/L   Alkaline Phosphatase 262 (H) 38 - 126 U/L   Total Bilirubin 1.6 (H) 0.3 - 1.2 mg/dL   GFR calc non Af Amer 30 (L) >60 mL/min   GFR calc Af Amer 35 (L) >60 mL/min   Anion gap 17 (H) 5 - 15  Lactic acid, plasma  Result Value Ref Range   Lactic Acid, Venous 3.8 (HH) 0.5 - 1.9 mmol/L  Lactic acid, plasma  Result Value Ref Range   Lactic Acid,  Venous 4.2 (HH) 0.5 - 1.9 mmol/L  Ammonia  Result Value Ref Range   Ammonia 10 9 - 35 umol/L  Ethanol  Result Value Ref Range   Alcohol, Ethyl (B) <10 <10 mg/dL  Troponin I  Result Value Ref Range   Troponin I <0.03 <0.03 ng/mL  CBG monitoring, ED  Result Value Ref Range   Glucose-Capillary 120 (H) 70 - 99 mg/dL   Laboratory interpretation all normal except hyponatremia, hypokalemia, low chloride, acute renal injury, elevated lactic acid, elevated anion gap    EKG EKG Interpretation  Date/Time:  Thursday August 04 2017 22:52:56 EDT Ventricular Rate:  90 PR Interval:    QRS Duration: 100 QT Interval:  385 QTC Calculation: 472 R Axis:     Text Interpretation:  Sinus rhythm Ventricular premature complex Low voltage, extremity and precordial leads Borderline repolarization abnormality No significant change since last tracing 13 Mar 2013 Confirmed by Rolland Porter [phone]) on 08/04/2017 11:07:27 PM   Radiology Dg Chest 2 View  Result Date: 08/05/2017 CLINICAL DATA:  67 year old male with weakness and constipation. History of prostate cancer. EXAM: ABDOMEN - 2 VIEW; CHEST - 2 VIEW COMPARISON:  None. FINDINGS: There are small bilateral pleural effusions with bibasilar atelectatic changes. Infiltrate is not excluded. Clinical correlation is recommended. There is an 11 mm pulmonary nodule seen on the lateral view. A 15 mm nodular density in the inferior right lung field may correspond to nipple shadow or represent a lung nodule. There is no pneumothorax. Mild cardiomegaly. There is moderate stool within the colon. Mildly dilated loops of small bowel noted in the upper and mid abdomen which may represent an ileus versus obstruction. There is air within the colon. No free air noted. Small calcific density over the right renal silhouette may represent renal calculi. Prostate brachytherapy seeds noted. There is degenerative changes of the spine. No acute osseous pathology. IMPRESSION: 1. Small  bilateral pleural effusions and bibasilar atelectasis versus infiltrate. 2. Pulmonary nodules. Further evaluation with chest CT on a nonemergent basis recommended. 3. Mildly dilated small bowel loops may represent an ileus versus obstruction. Clinical correlation is recommended. Electronically Signed   By: Anner Crete M.D.   On: 08/05/2017 00:35   Ct Head Wo Contrast  Result Date: 08/05/2017 CLINICAL DATA:  Nasal congestion starting a few weeks ago. EXAM: CT HEAD WITHOUT CONTRAST TECHNIQUE: Contiguous axial images were obtained from the base of the skull through the vertex without intravenous contrast. COMPARISON:  None. FINDINGS: Brain: Mild age related involutional changes the brain with chronic appearing small vessel ischemic disease periventricular white matter. No large vascular territory infarct, hemorrhage, intra-axial mass nor extra-axial fluid collections. Midline fourth ventricle and basal cisterns without effacement. No hydrocephalus. Brainstem and cerebellum are nonacute. Vascular: No hyperdense vessel sign. Skull: Normal. Negative for fracture or focal lesion. Sinuses/Orbits: Clear paranasal sinuses and mastoids without significant mucosal thickening. Intact orbits and globes. Other: None IMPRESSION: Age related involutional changes of the brain with likely chronic small vessel ischemic disease. No acute intracranial abnormality. The included paranasal sinuses are clear. Electronically Signed   By: Ashley Royalty M.D.   On: 08/05/2017 00:23   Dg Abd 2 Views  Result Date: 08/05/2017 CLINICAL DATA:  67 year old male with weakness and constipation. History of prostate cancer. EXAM: ABDOMEN - 2 VIEW; CHEST - 2 VIEW COMPARISON:  None. FINDINGS: There are small bilateral pleural effusions with bibasilar atelectatic changes. Infiltrate is not excluded. Clinical correlation is recommended. There is an 11 mm pulmonary nodule seen on the lateral view. A 15 mm nodular density in the inferior right lung  field may correspond to nipple shadow or represent a lung nodule. There is no pneumothorax. Mild cardiomegaly. There is moderate stool within the  colon. Mildly dilated loops of small bowel noted in the upper and mid abdomen which may represent an ileus versus obstruction. There is air within the colon. No free air noted. Small calcific density over the right renal silhouette may represent renal calculi. Prostate brachytherapy seeds noted. There is degenerative changes of the spine. No acute osseous pathology. IMPRESSION: 1. Small bilateral pleural effusions and bibasilar atelectasis versus infiltrate. 2. Pulmonary nodules. Further evaluation with chest CT on a nonemergent basis recommended. 3. Mildly dilated small bowel loops may represent an ileus versus obstruction. Clinical correlation is recommended. Electronically Signed   By: Anner Crete M.D.   On: 08/05/2017 00:35    Procedures .Critical Care Performed by: Rolland Porter, MD Authorized by: Rolland Porter, MD   Critical care provider statement:    Critical care time (minutes):  33   Critical care was necessary to treat or prevent imminent or life-threatening deterioration of the following conditions:  Metabolic crisis   Critical care was time spent personally by me on the following activities:  Discussions with consultants, evaluation of patient's response to treatment, examination of patient, obtaining history from patient or surrogate, ordering and review of laboratory studies, ordering and review of radiographic studies, re-evaluation of patient's condition and review of old charts   (including critical care time)  Medications Ordered in ED Medications  azithromycin (ZITHROMAX) 500 mg in sodium chloride 0.9 % 250 mL IVPB (500 mg Intravenous New Bag/Given 08/05/17 0217)  sodium chloride 0.9 % bolus 1,000 mL (1,000 mLs Intravenous New Bag/Given 08/05/17 0023)  sodium chloride 0.9 % bolus 1,000 mL (1,000 mLs Intravenous New Bag/Given 08/05/17  0022)  cefTRIAXone (ROCEPHIN) 1 g in sodium chloride 0.9 % 100 mL IVPB (1 g Intravenous New Bag/Given 08/05/17 0139)     Initial Impression / Assessment and Plan / ED Course  I have reviewed the triage vital signs and the nursing notes.  Pertinent labs & imaging results that were available during my care of the patient were reviewed by me and considered in my medical decision making (see chart for details).   Patient's history was vague, therefore head CT was done to evaluate his feeling off balance and possible altered mental status, chest x-ray and abdominal x-ray with 2 views was done to evaluate his complaints of constipation.  Laboratory testing was done.  Patient's initial lactic acid came back elevated, he was given 2 L of fluid.  Patient's BUN and creatinine have come back elevated, when I look at his chart from [facility] on May 31, 2016 his BUN was 13 and his creatinine was 1.0.  He is followed by the department of internal medicine and his last few visits were mainly dealing with adjusting his medications for his depression.  I reviewed his x-ray results and there is questionable atelectasis versus pneumonia, I elected to treat him for community-acquired pneumonia with Rocephin and Zithromax.  02:16 AM Dr Manuella Ghazi, hospitalist, will admit  Final Clinical Impressions(s) / ED Diagnoses   Final diagnoses:  Acute renal injury ([place])  Hyponatremia  Chloride, decreased level  Lactic acidosis  Community acquired pneumonia, unspecified laterality    Plan admission  Rolland Porter, MD, Barbette Or, MD 08/05/17 [phone]

## 2017-08-05 ENCOUNTER — Inpatient Hospital Stay (HOSPITAL_COMMUNITY): Payer: Medicare HMO

## 2017-08-05 ENCOUNTER — Encounter (HOSPITAL_COMMUNITY): Payer: Self-pay

## 2017-08-05 DIAGNOSIS — I129 Hypertensive chronic kidney disease with stage 1 through stage 4 chronic kidney disease, or unspecified chronic kidney disease: Secondary | ICD-10-CM | POA: Diagnosis present

## 2017-08-05 DIAGNOSIS — I10 Essential (primary) hypertension: Secondary | ICD-10-CM

## 2017-08-05 DIAGNOSIS — E872 Acidosis, unspecified: Secondary | ICD-10-CM

## 2017-08-05 DIAGNOSIS — E876 Hypokalemia: Secondary | ICD-10-CM | POA: Diagnosis present

## 2017-08-05 DIAGNOSIS — I313 Pericardial effusion (noninflammatory): Secondary | ICD-10-CM | POA: Diagnosis present

## 2017-08-05 DIAGNOSIS — J189 Pneumonia, unspecified organism: Secondary | ICD-10-CM | POA: Diagnosis present

## 2017-08-05 DIAGNOSIS — K567 Ileus, unspecified: Secondary | ICD-10-CM | POA: Diagnosis present

## 2017-08-05 DIAGNOSIS — Z8546 Personal history of malignant neoplasm of prostate: Secondary | ICD-10-CM | POA: Diagnosis not present

## 2017-08-05 DIAGNOSIS — C61 Malignant neoplasm of prostate: Secondary | ICD-10-CM | POA: Diagnosis present

## 2017-08-05 DIAGNOSIS — F1010 Alcohol abuse, uncomplicated: Secondary | ICD-10-CM | POA: Diagnosis present

## 2017-08-05 DIAGNOSIS — F419 Anxiety disorder, unspecified: Secondary | ICD-10-CM | POA: Diagnosis present

## 2017-08-05 DIAGNOSIS — C799 Secondary malignant neoplasm of unspecified site: Secondary | ICD-10-CM | POA: Diagnosis present

## 2017-08-05 DIAGNOSIS — R911 Solitary pulmonary nodule: Secondary | ICD-10-CM

## 2017-08-05 DIAGNOSIS — I959 Hypotension, unspecified: Secondary | ICD-10-CM | POA: Diagnosis present

## 2017-08-05 DIAGNOSIS — I314 Cardiac tamponade: Secondary | ICD-10-CM | POA: Diagnosis present

## 2017-08-05 DIAGNOSIS — J90 Pleural effusion, not elsewhere classified: Secondary | ICD-10-CM | POA: Diagnosis present

## 2017-08-05 DIAGNOSIS — E878 Other disorders of electrolyte and fluid balance, not elsewhere classified: Secondary | ICD-10-CM | POA: Diagnosis not present

## 2017-08-05 DIAGNOSIS — F329 Major depressive disorder, single episode, unspecified: Secondary | ICD-10-CM | POA: Diagnosis present

## 2017-08-05 DIAGNOSIS — R188 Other ascites: Secondary | ICD-10-CM | POA: Diagnosis present

## 2017-08-05 DIAGNOSIS — E860 Dehydration: Secondary | ICD-10-CM | POA: Diagnosis present

## 2017-08-05 DIAGNOSIS — R569 Unspecified convulsions: Secondary | ICD-10-CM | POA: Diagnosis not present

## 2017-08-05 DIAGNOSIS — N183 Chronic kidney disease, stage 3 (moderate): Secondary | ICD-10-CM | POA: Diagnosis present

## 2017-08-05 DIAGNOSIS — N179 Acute kidney failure, unspecified: Secondary | ICD-10-CM | POA: Diagnosis present

## 2017-08-05 DIAGNOSIS — E871 Hypo-osmolality and hyponatremia: Secondary | ICD-10-CM | POA: Diagnosis present

## 2017-08-05 DIAGNOSIS — G250 Essential tremor: Secondary | ICD-10-CM | POA: Diagnosis present

## 2017-08-05 DIAGNOSIS — I480 Paroxysmal atrial fibrillation: Secondary | ICD-10-CM | POA: Diagnosis present

## 2017-08-05 DIAGNOSIS — I3139 Other pericardial effusion (noninflammatory): Secondary | ICD-10-CM

## 2017-08-05 HISTORY — DX: Pericardial effusion (noninflammatory): I31.3

## 2017-08-05 HISTORY — DX: Other pericardial effusion (noninflammatory): I31.39

## 2017-08-05 LAB — BASIC METABOLIC PANEL
ANION GAP: 16 — AB (ref 5–15)
BUN: 59 mg/dL — ABNORMAL HIGH (ref 8–23)
CO2: 18 mmol/L — ABNORMAL LOW (ref 22–32)
Calcium: 8.3 mg/dL — ABNORMAL LOW (ref 8.9–10.3)
Chloride: 94 mmol/L — ABNORMAL LOW (ref 98–111)
Creatinine, Ser: 2.03 mg/dL — ABNORMAL HIGH (ref 0.61–1.24)
GFR calc Af Amer: 38 mL/min — ABNORMAL LOW (ref >60)
GFR calc non Af Amer: 32 mL/min — ABNORMAL LOW (ref >60)
GLUCOSE: 112 mg/dL — AB (ref 70–99)
POTASSIUM: 3.7 mmol/L (ref 3.5–5.1)
Sodium: 128 mmol/L — ABNORMAL LOW (ref 135–145)

## 2017-08-05 LAB — GLUCOSE, PLEURAL OR PERITONEAL FLUID: Glucose, Fluid: 103 mg/dL

## 2017-08-05 LAB — ECHOCARDIOGRAM COMPLETE
HEIGHTINCHES: 68 in
WEIGHTICAEL: 2433.88 [oz_av]

## 2017-08-05 LAB — OSMOLALITY: OSMOLALITY: 283 mosm/kg (ref 275–295)

## 2017-08-05 LAB — BODY FLUID CELL COUNT WITH DIFFERENTIAL
EOS FL: 0 %
Lymphs, Fluid: 77 %
MONOCYTE-MACROPHAGE-SEROUS FLUID: 10 % — AB (ref 50–90)
NEUTROPHIL FLUID: 13 % (ref 0–25)
OTHER CELLS FL: REACTIVE %
Total Nucleated Cell Count, Fluid: 343 cu mm (ref 0–1000)

## 2017-08-05 LAB — LACTIC ACID, PLASMA
LACTIC ACID, VENOUS: 3.2 mmol/L — AB (ref 0.5–1.9)
Lactic Acid, Venous: 3.8 mmol/L (ref 0.5–1.9)
Lactic Acid, Venous: 4.2 mmol/L (ref 0.5–1.9)

## 2017-08-05 LAB — GRAM STAIN

## 2017-08-05 LAB — ALBUMIN, PLEURAL OR PERITONEAL FLUID: Albumin, Fluid: 1.7 g/dL

## 2017-08-05 LAB — PROTEIN, PLEURAL OR PERITONEAL FLUID: Total protein, fluid: <3 g/dL

## 2017-08-05 LAB — MAGNESIUM: Magnesium: 2.5 mg/dL — ABNORMAL HIGH (ref 1.7–2.4)

## 2017-08-05 LAB — SEDIMENTATION RATE: SED RATE: 51 mm/h — AB (ref 0–16)

## 2017-08-05 LAB — TSH: TSH: 2.51 u[IU]/mL (ref 0.350–4.500)

## 2017-08-05 LAB — LACTATE DEHYDROGENASE, PLEURAL OR PERITONEAL FLUID: LD, Fluid: 98 U/L — ABNORMAL HIGH (ref 3–23)

## 2017-08-05 LAB — LACTATE DEHYDROGENASE: LDH: 144 U/L (ref 98–192)

## 2017-08-05 MED ORDER — LORAZEPAM 1 MG PO TABS: 1.0000 mg | ORAL_TABLET | Freq: Four times a day (QID) | ORAL | Status: DC | PRN

## 2017-08-05 MED ORDER — SODIUM CHLORIDE 0.9 % IV SOLN
1.0000 g | INTRAVENOUS | Status: DC
  Administered 2017-08-06 – 2017-08-11 (×6): 1 g via INTRAVENOUS
  Filled 2017-08-05 (×9): qty 10

## 2017-08-05 MED ORDER — AMLODIPINE BESYLATE 5 MG PO TABS
5.0000 mg | ORAL_TABLET | Freq: Every day | ORAL | Status: DC
  Administered 2017-08-05 – 2017-08-07 (×3): 5 mg via ORAL
  Filled 2017-08-05 (×3): qty 1

## 2017-08-05 MED ORDER — ACETAMINOPHEN 650 MG RE SUPP: 650.0000 mg | Freq: Four times a day (QID) | RECTAL | Status: DC | PRN

## 2017-08-05 MED ORDER — LORAZEPAM 2 MG/ML IJ SOLN: 1.0000 mg | Freq: Four times a day (QID) | INTRAMUSCULAR | Status: DC | PRN

## 2017-08-05 MED ORDER — GUAIFENESIN-DM 100-10 MG/5ML PO SYRP
5.0000 mL | ORAL_SOLUTION | ORAL | Status: DC | PRN
  Administered 2017-08-06: 5 mL via ORAL
  Filled 2017-08-05: qty 5

## 2017-08-05 MED ORDER — TOPIRAMATE 100 MG PO TABS
100.0000 mg | ORAL_TABLET | Freq: Two times a day (BID) | ORAL | Status: DC
  Administered 2017-08-05 – 2017-08-09 (×10): 100 mg via ORAL
  Filled 2017-08-05: qty 4
  Filled 2017-08-05 (×15): qty 1

## 2017-08-05 MED ORDER — SODIUM CHLORIDE 0.9 % IV SOLN
1.0000 g | Freq: Once | INTRAVENOUS | Status: AC
  Administered 2017-08-05: 1 g via INTRAVENOUS
  Filled 2017-08-05: qty 10

## 2017-08-05 MED ORDER — NICOTINE 7 MG/24HR TD PT24
7.0000 mg | MEDICATED_PATCH | Freq: Every day | TRANSDERMAL | Status: DC
  Administered 2017-08-05: 7 mg via TRANSDERMAL
  Filled 2017-08-05 (×2): qty 1

## 2017-08-05 MED ORDER — OXYBUTYNIN CHLORIDE 5 MG PO TABS
5.0000 mg | ORAL_TABLET | Freq: Two times a day (BID) | ORAL | Status: DC
  Administered 2017-08-05 – 2017-08-09 (×10): 5 mg via ORAL
  Filled 2017-08-05 (×14): qty 1

## 2017-08-05 MED ORDER — ADULT MULTIVITAMIN W/MINERALS CH
1.0000 | ORAL_TABLET | Freq: Every day | ORAL | Status: DC
  Administered 2017-08-05 – 2017-08-09 (×4): 1 via ORAL
  Filled 2017-08-05 (×5): qty 1

## 2017-08-05 MED ORDER — DEXTROSE 5 % IV SOLN
250.0000 mg | INTRAVENOUS | Status: DC
  Filled 2017-08-05 (×3): qty 250

## 2017-08-05 MED ORDER — NICOTINE 21 MG/24HR TD PT24
21.0000 mg | MEDICATED_PATCH | Freq: Every day | TRANSDERMAL | Status: DC
  Administered 2017-08-06 – 2017-08-10 (×5): 21 mg via TRANSDERMAL
  Filled 2017-08-05 (×6): qty 1

## 2017-08-05 MED ORDER — IPRATROPIUM-ALBUTEROL 0.5-2.5 (3) MG/3ML IN SOLN: 3.0000 mL | Freq: Four times a day (QID) | RESPIRATORY_TRACT | Status: DC | PRN

## 2017-08-05 MED ORDER — AZITHROMYCIN 500 MG IV SOLR
500.0000 mg | Freq: Once | INTRAVENOUS | Status: AC
  Administered 2017-08-05: 500 mg via INTRAVENOUS
  Filled 2017-08-05: qty 500

## 2017-08-05 MED ORDER — POTASSIUM CHLORIDE 20 MEQ/15ML (10%) PO SOLN
40.0000 meq | Freq: Once | ORAL | Status: AC
  Administered 2017-08-05: 40 meq via ORAL
  Filled 2017-08-05: qty 30

## 2017-08-05 MED ORDER — HEPARIN SODIUM (PORCINE) 5000 UNIT/ML IJ SOLN
5000.0000 [IU] | Freq: Three times a day (TID) | INTRAMUSCULAR | Status: DC
  Administered 2017-08-05 – 2017-08-08 (×8): 5000 [IU] via SUBCUTANEOUS
  Filled 2017-08-05 (×9): qty 1

## 2017-08-05 MED ORDER — SODIUM CHLORIDE 0.9 % IV BOLUS
1000.0000 mL | Freq: Once | INTRAVENOUS | Status: AC
  Administered 2017-08-05: 1000 mL via INTRAVENOUS

## 2017-08-05 MED ORDER — AZITHROMYCIN 500 MG IV SOLR
INTRAVENOUS | Status: DC
  Administered 2017-08-06 – 2017-08-11 (×6): via INTRAVENOUS
  Filled 2017-08-05 (×9): qty 250

## 2017-08-05 MED ORDER — BOOST / RESOURCE BREEZE PO LIQD CUSTOM
1.0000 | Freq: Three times a day (TID) | ORAL | Status: DC
  Administered 2017-08-05 – 2017-08-10 (×10): 1 via ORAL

## 2017-08-05 MED ORDER — SODIUM CHLORIDE 0.9 % IV SOLN
INTRAVENOUS | Status: AC
  Administered 2017-08-05: 05:00:00 via INTRAVENOUS

## 2017-08-05 MED ORDER — CHLORHEXIDINE GLUCONATE 0.12 % MT SOLN
15.0000 mL | Freq: Two times a day (BID) | OROMUCOSAL | Status: DC
  Administered 2017-08-05 – 2017-08-09 (×10): 15 mL via OROMUCOSAL
  Filled 2017-08-05 (×10): qty 15

## 2017-08-05 MED ORDER — BISACODYL 10 MG RE SUPP
10.0000 mg | Freq: Once | RECTAL | Status: DC
  Filled 2017-08-05: qty 1

## 2017-08-05 MED ORDER — ONDANSETRON HCL 4 MG/2ML IJ SOLN
4.0000 mg | Freq: Four times a day (QID) | INTRAMUSCULAR | Status: DC | PRN
  Administered 2017-08-08 – 2017-08-12 (×3): 4 mg via INTRAVENOUS
  Filled 2017-08-05 (×3): qty 2

## 2017-08-05 MED ORDER — FOLIC ACID 1 MG PO TABS
1.0000 mg | ORAL_TABLET | Freq: Every day | ORAL | Status: DC
  Administered 2017-08-05 – 2017-08-09 (×4): 1 mg via ORAL
  Filled 2017-08-05 (×5): qty 1

## 2017-08-05 MED ORDER — FLUTICASONE PROPIONATE 50 MCG/ACT NA SUSP
1.0000 | Freq: Every day | NASAL | Status: DC
  Administered 2017-08-05 – 2017-08-08 (×3): 1 via NASAL
  Filled 2017-08-05 (×4): qty 16

## 2017-08-05 MED ORDER — CLONAZEPAM 1 MG PO TABS
1.0000 mg | ORAL_TABLET | Freq: Three times a day (TID) | ORAL | Status: DC
  Filled 2017-08-05: qty 1
  Filled 2017-08-05: qty 2
  Filled 2017-08-05: qty 1

## 2017-08-05 MED ORDER — ONDANSETRON HCL 4 MG PO TABS: 4.0000 mg | ORAL_TABLET | Freq: Four times a day (QID) | ORAL | Status: DC | PRN

## 2017-08-05 MED ORDER — ORAL CARE MOUTH RINSE
15.0000 mL | Freq: Two times a day (BID) | OROMUCOSAL | Status: DC
  Administered 2017-08-05 – 2017-08-10 (×6): 15 mL via OROMUCOSAL

## 2017-08-05 MED ORDER — CLONIDINE HCL 0.1 MG PO TABS
0.1000 mg | ORAL_TABLET | Freq: Two times a day (BID) | ORAL | Status: DC
  Administered 2017-08-05 – 2017-08-07 (×4): 0.1 mg via ORAL
  Filled 2017-08-05 (×5): qty 1

## 2017-08-05 MED ORDER — TAMSULOSIN HCL 0.4 MG PO CAPS
0.4000 mg | ORAL_CAPSULE | Freq: Every day | ORAL | Status: DC
  Administered 2017-08-05 – 2017-08-09 (×5): 0.4 mg via ORAL
  Filled 2017-08-05 (×6): qty 1

## 2017-08-05 MED ORDER — VITAMIN B-1 100 MG PO TABS
100.0000 mg | ORAL_TABLET | Freq: Every day | ORAL | Status: DC
  Administered 2017-08-05 – 2017-08-09 (×4): 100 mg via ORAL
  Filled 2017-08-05 (×5): qty 1

## 2017-08-05 MED ORDER — THIAMINE HCL 100 MG/ML IJ SOLN: 100.0000 mg | Freq: Every day | INTRAMUSCULAR | Status: DC

## 2017-08-05 MED ORDER — SERTRALINE HCL 100 MG PO TABS
100.0000 mg | ORAL_TABLET | Freq: Every day | ORAL | Status: DC
  Administered 2017-08-05 – 2017-08-09 (×5): 100 mg via ORAL
  Filled 2017-08-05 (×4): qty 1
  Filled 2017-08-05: qty 2
  Filled 2017-08-05: qty 1

## 2017-08-05 MED ORDER — ACETAMINOPHEN 325 MG PO TABS: 650.0000 mg | ORAL_TABLET | Freq: Four times a day (QID) | ORAL | Status: DC | PRN

## 2017-08-05 MED ORDER — GABAPENTIN 300 MG PO CAPS
300.0000 mg | ORAL_CAPSULE | Freq: Two times a day (BID) | ORAL | Status: DC
  Filled 2017-08-05 (×3): qty 1

## 2017-08-05 MED ORDER — POLYETHYLENE GLYCOL 3350 17 G PO PACK
17.0000 g | PACK | Freq: Once | ORAL | Status: AC
  Administered 2017-08-05: 17 g via ORAL
  Filled 2017-08-05: qty 1

## 2017-08-05 NOTE — Progress Notes (Signed)
PROGRESS NOTE                                                                                                                                                                                                             Patient Demographics:    [name] [name], is a 67 y.o. male, DOB - 1950-09-27, IRC:[id number]  Admit date - 08/04/2017   Admitting Physician Pratik Darleen Crocker, DO  Outpatient Primary MD for the patient is [name], MD  LOS - 0  Outpatient Specialists: Urologist at [facility] Complaint  Patient presents with  . Weakness    generalized       Brief Narrative   67 year old male with history of prostate cancer (reports getting radiation therapy), history of alcohol abuse (reports not having a drink for past few weeks, used to drink at least 6 packs of beer daily), heavy smoker, hypertension, anxiety/depression and essential tremors who presented to the ED with sinus congestion, weakness and unsteady gait for past few weeks which has gotten worse in the past few days.  He also reports very poor appetite and per his ex-wife at bedside he has lost probably about 100 pounds since he last saw him 3-4 months ago.  Patient is a poor historian.  He complained that he was having some sinus congestion and cough being unable to produce any phlegm.  He also reported some shortness of breath.  He denied any fevers, chills, chest pain, orthopnea, PND, nausea, vomiting, abdominal pain, dysuria, diarrhea, tingling or numbness of his extremities.  In the ED his vitals were stable but labs showed WBC of 13 point 8K hemoglobin of 12.6, platelets of 236, sodium of 125, potassium of 3.1, AKI with BUN of 54 and creatinine of 2.16 calcium of 8.7, AST of 42, alkaline phosphatase of 262, bilirubin of 1.6 and lactic acid of 4.2 which subsequently improved to 3.2.  Her chest x-ray done showed small bilateral pleural effusion and bibasilar  atelectasis versus infiltrate along with pulmonary nodules.  Bowel loops concerning for ileus. CT of the head was done which was negative for acute findings.  CT of the chest without contrast given pulmonary nodules showed bilateral pulmonary nodules with mediastinal  and retroperitoneal adenopathy my hepatic lesions, omental and peritoneal implants and bilateral pleural effusion along with ascites.  Findings are concerning for metastatic  disease of unknown primary.  Also found to have large pericardial effusion measuring 3 cm in thickness.  Patient admitted to telemetry for further management.  2D echo done showed large pericardial effusion with mild tamponade physiology.  Discussed with cardiology with plan to transfer to [facility] for close monitoring.    Subjective:   Patient complains of feeling weak and tired with some shortness of breath.  Ex-wife at bedside.   Assessment  & Plan :   Principal problem Pericardial effusion 2D echo with large pericardial effusion with borderline tamponade physiology.  Clinically no signs of cardiac tamponade.  Discussed with cardiology (Dr. Harl Bowie) who recommended transfer to [facility] for close monitoring and in case pericardiocentesis would be warranted.  For now recommends close monitoring with repeat echo in 24-48 hours.  Pulmonary nodules with bilateral pleural effusions and possible pneumonia On empiric Rocephin and azithromycin.  O2 via nasal cannula as needed.  Ordered for ultrasound thoracentesis and labs sent for cell count, culture, LDH, albumin and cytology.   Diffuse metastatic disease with unknown primary.   Has bilateral pulmonary, retroperitoneal, mediastinal lymph node and hepatic lesions suggestive of diffuse metastatic disease of unknown primary.  Ultrasound thoracentesis ordered and hopefully can get a tissue diagnosis.  If not then will need IR consult for possible liver biopsy (lesion in the right lobe of liver). Oncology consult  based on cytology result.  Patient also presented with unsteady gait and cannot rule out brain metastasis.  Once renal function has improved he will need CT of the abdomen and pelvis with contrast along with MRI of the brain.   Acute kidney injury Suspect prerenal.  Slowly improved with IV fluids.  Avoid nephrotoxins.   Hyponatremia Pending urine lytes.  Suspect prerenal versus SIADH with pneumonia/malignancy.  Slowly improving a.m. lab.  Will minimize IV fluid use given pericardial effusion.  Elevated lactic acid Does not have acute sepsis clinically.  Monitor for now.  Tobacco abuse Counseled strongly on cessation.  Smokes 1/2 pack/day.  Ordered nicotine patch.  Alcohol abuse No signs of withdrawal.  Reports he has not been drinking in the past several days.  Monitor on CIWA.  Anxiety and depression Resume home meds  Code Status : Full code  Family Communication  : Ex-wife at bedside  Disposition Plan  : Transfer to [facility] tele[facility]  Barriers For Discharge : Active symptoms  Consults  : Cardiology  Procedures  : 2D echo, CT head, CT chest without contrast,  DVT Prophylaxis  : Subcu heparin  Lab Results  Component Value Date   PLT 236 08/04/2017    Antibiotics  :    Anti-infectives (From admission, onward)   Start     Dose/Rate Route Frequency Ordered Stop   08/06/17 0200  cefTRIAXone (ROCEPHIN) 1 g in sodium chloride 0.9 % 100 mL IVPB     1 g 200 mL/hr over 30 Minutes Intravenous Every 24 hours 08/05/17 0257     08/06/17 0200  azithromycin (ZITHROMAX) 250 mg in dextrose 5 % 125 mL IVPB  Status:  Discontinued     250 mg 125 mL/hr over 60 Minutes Intravenous Every 24 hours 08/05/17 0257 08/05/17 1333   08/06/17 0200  azithromycin (ZITHROMAX) 250 mg in sodium chloride 0.9 % 100 mL injection      Intravenous Every 24 hours 08/05/17 1334     08/05/17 0115  cefTRIAXone (ROCEPHIN) 1 g in sodium chloride 0.9 % 100 mL IVPB     1 g 200 mL/hr  over 30 Minutes  Intravenous  Once 08/05/17 0113 08/05/17 0244   08/05/17 0115  azithromycin (ZITHROMAX) 500 mg in sodium chloride 0.9 % 250 mL IVPB     500 mg 250 mL/hr over 60 Minutes Intravenous  Once 08/05/17 0113 08/05/17 0317        Objective:   Vitals:   08/05/17 0200 08/05/17 0348 08/05/17 0613 08/05/17 1149  BP: (!) 121/94 (!) 132/100 114/87 (!) 121/92  Pulse: 81 88 90 84  Resp: 20 18 18 16   Temp:  97.7 F (36.5 C)  (!) 97.5 F (36.4 C)  TempSrc:  Oral  Oral  SpO2: 99% 99% 100% 100%  Weight:  69 kg (152 lb 1.9 oz)    Height:  5\' 8"  (1.727 m)      Wt Readings from Last 3 Encounters:  08/05/17 69 kg (152 lb 1.9 oz)     Intake/Output Summary (Last 24 hours) at 08/05/2017 1337 Last data filed at 08/05/2017 1027 Gross per 24 hour  Intake 425 ml  Output -  Net 425 ml     Physical Exam  Gen: Elderly male appears fatigued HEENT: No pallor, no icterus, temporal wasting, moist mucosa, palpable cervical lymph nodes bilaterally Chest: Diminished breath sounds over lung bases, no added sounds CVS: S1-S2 normal, no murmurs rub or gallop GI: Soft, nondistended, nontender, no organomegaly, bowel sounds present Muscular skeletal: Warm, no edema CNS: Alert and oriented, nonfocal, gait not assessed    Data Review:    CBC Recent Labs  Lab 08/04/17 2312  WBC 13.8*  HGB 12.6*  HCT 37.0*  PLT 236  MCV 84.5  MCH 28.8  MCHC 34.1  RDW 14.2    Chemistries  Recent Labs  Lab 08/04/17 2312 08/05/17 1209  NA 125* 128*  K 3.1* 3.7  CL 87* 94*  CO2 21* 18*  GLUCOSE 117* 112*  BUN 54* 59*  CREATININE 2.16* 2.03*  CALCIUM 8.7* 8.3*  MG 2.5*  --   AST 42*  --   ALT 20  --   ALKPHOS 262*  --   BILITOT 1.6*  --    ------------------------------------------------------------------------------------------------------------------ No results for input(s): CHOL, HDL, LDLCALC, TRIG, CHOLHDL, LDLDIRECT in the last 72 hours.  No results found for:  HGBA1C ------------------------------------------------------------------------------------------------------------------ Recent Labs    08/04/17 2312  TSH 2.510   ------------------------------------------------------------------------------------------------------------------ No results for input(s): VITAMINB12, FOLATE, FERRITIN, TIBC, IRON, RETICCTPCT in the last 72 hours.  Coagulation profile No results for input(s): INR, PROTIME in the last 168 hours.  No results for input(s): DDIMER in the last 72 hours.  Cardiac Enzymes Recent Labs  Lab 08/04/17 2312  TROPONINI <0.03   ------------------------------------------------------------------------------------------------------------------ No results found for: BNP  Inpatient Medications  Scheduled Meds: . amLODipine  5 mg Oral Daily  . bisacodyl  10 mg Rectal Once  . chlorhexidine  15 mL Mouth Rinse BID  . clonazePAM  1 mg Oral TID  . cloNIDine  0.1 mg Oral BID  . feeding supplement  1 Container Oral TID BM  . fluticasone  1 spray Each Nare Daily  . folic acid  1 mg Oral Daily  . gabapentin  300 mg Oral BID  . heparin  5,000 Units Subcutaneous Q8H  . mouth rinse  15 mL Mouth Rinse q12n4p  . multivitamin with minerals  1 tablet Oral Daily  . [START ON 08/06/2017] nicotine  21 mg Transdermal Daily  . oxybutynin  5 mg Oral BID  . sertraline  100 mg Oral Daily  .  tamsulosin  0.4 mg Oral Daily  . thiamine  100 mg Oral Daily   Or  . thiamine  100 mg Intravenous Daily  . topiramate  100 mg Oral BID   Continuous Infusions: . sodium chloride 75 mL/hr at 08/05/17 0434  . [START ON 08/06/2017] small volume/piggyback builder    . [START ON 08/06/2017] cefTRIAXone (ROCEPHIN)  IV     PRN Meds:.acetaminophen **OR** acetaminophen, guaiFENesin-dextromethorphan, ipratropium-albuterol, LORazepam **OR** LORazepam, ondansetron **OR** ondansetron (ZOFRAN) IV  Micro Results No results found for this or any previous visit (from the past  240 hour(s)).  Radiology Reports Dg Chest 2 View  Result Date: 08/05/2017 CLINICAL DATA:  67 year old male with weakness and constipation. History of prostate cancer. EXAM: ABDOMEN - 2 VIEW; CHEST - 2 VIEW COMPARISON:  None. FINDINGS: There are small bilateral pleural effusions with bibasilar atelectatic changes. Infiltrate is not excluded. Clinical correlation is recommended. There is an 11 mm pulmonary nodule seen on the lateral view. A 15 mm nodular density in the inferior right lung field may correspond to nipple shadow or represent a lung nodule. There is no pneumothorax. Mild cardiomegaly. There is moderate stool within the colon. Mildly dilated loops of small bowel noted in the upper and mid abdomen which may represent an ileus versus obstruction. There is air within the colon. No free air noted. Small calcific density over the right renal silhouette may represent renal calculi. Prostate brachytherapy seeds noted. There is degenerative changes of the spine. No acute osseous pathology. IMPRESSION: 1. Small bilateral pleural effusions and bibasilar atelectasis versus infiltrate. 2. Pulmonary nodules. Further evaluation with chest CT on a nonemergent basis recommended. 3. Mildly dilated small bowel loops may represent an ileus versus obstruction. Clinical correlation is recommended. Electronically Signed   By: Anner Crete M.D.   On: 08/05/2017 00:35   Ct Head Wo Contrast  Result Date: 08/05/2017 CLINICAL DATA:  Nasal congestion starting a few weeks ago. EXAM: CT HEAD WITHOUT CONTRAST TECHNIQUE: Contiguous axial images were obtained from the base of the skull through the vertex without intravenous contrast. COMPARISON:  None. FINDINGS: Brain: Mild age related involutional changes the brain with chronic appearing small vessel ischemic disease periventricular white matter. No large vascular territory infarct, hemorrhage, intra-axial mass nor extra-axial fluid collections. Midline fourth ventricle and  basal cisterns without effacement. No hydrocephalus. Brainstem and cerebellum are nonacute. Vascular: No hyperdense vessel sign. Skull: Normal. Negative for fracture or focal lesion. Sinuses/Orbits: Clear paranasal sinuses and mastoids without significant mucosal thickening. Intact orbits and globes. Other: None IMPRESSION: Age related involutional changes of the brain with likely chronic small vessel ischemic disease. No acute intracranial abnormality. The included paranasal sinuses are clear. Electronically Signed   By: Ashley Royalty M.D.   On: 08/05/2017 00:23   Ct Chest Wo Contrast  Result Date: 08/05/2017 CLINICAL DATA:  67 year old male with pulmonary nodules and enlarged lymph nodes. EXAM: CT CHEST WITHOUT CONTRAST TECHNIQUE: Multidetector CT imaging of the chest was performed following the standard protocol without IV contrast. COMPARISON:  Chest radiograph dated 08/04/2017 FINDINGS: Evaluation of this exam is limited in the absence of intravenous contrast. Cardiovascular: There is no cardiomegaly. There is a large pericardial effusion measuring approximately 3 cm in thickness. Eighth cardia may provide better evaluation of the pericardial effusion and assessment for cardiac ejection fraction. There is mild atherosclerotic calcification of the thoracic aorta. The central pulmonary arteries are grossly unremarkable on this noncontrast CT. Mediastinum/Nodes: There are multiple enlarged mediastinal lymph nodes measuring up to 2.3  cm in the right paratracheal region and 3 cm in the subcarinal node. Esophagus is grossly unremarkable. Small and heterogeneous thyroid gland with possible tiny hypodense nodules and small calcific foci. Ultrasound may provide better evaluation of the thyroid gland if clinically indicated. Lungs/Pleura: There are small bilateral pleural effusions with associated partial compressive atelectasis of the lower lobes. Pneumonia is not excluded. Multiple bilateral pulmonary nodules measure  up to 14 mm at the right lung base and 10 mm in the left upper lobe concerning for metastatic disease given findings of adenopathy and pleural effusion. There is no pneumothorax. There is centrilobular and paraseptal emphysema. The central airways are patent. Upper Abdomen: There is a small ascites. Two hypodense lesions in the right lobe of the liver measure up to 21 x 18 mm suspicious for metastatic disease. Partially visualized gallstones. Retroperitoneal adenopathy measure up to 2.4 cm to the left of the aorta. There are areas of nodularity in the upper mesentery and omentum concerning for metastatic implant. Areas of nodularity along the diaphragms bilaterally also concerning for peritoneal implants. Musculoskeletal: No chest wall mass or suspicious bone lesions identified. IMPRESSION: 1. Bilateral pulmonary nodules, mediastinal and retroperitoneal adenopathy, hepatic lesions, omental and peritoneal implants, and bilateral pleural effusions and ascites. The constellation of findings most consistent with metastatic disease of indeterminate primary. Further evaluation with CT of the abdomen and pelvis with oral and IV contrast on a nonemergent basis recommended. Sampling of the pleural effusion or ascites may provide diagnostic cytology. 2. Large pericardial effusion measuring 3 cm in thickness. Echocardiogram may provide better evaluation of the pericardial effusion and cardiac function. These results were called by telephone at the time of interpretation on 08/05/2017 at 3:45 am to nurse Marcello Moores , who verbally acknowledged these results. Electronically Signed   By: Anner Crete M.D.   On: 08/05/2017 03:49   Dg Abd 2 Views  Result Date: 08/05/2017 CLINICAL DATA:  67 year old male with weakness and constipation. History of prostate cancer. EXAM: ABDOMEN - 2 VIEW; CHEST - 2 VIEW COMPARISON:  None. FINDINGS: There are small bilateral pleural effusions with bibasilar atelectatic changes. Infiltrate is not  excluded. Clinical correlation is recommended. There is an 11 mm pulmonary nodule seen on the lateral view. A 15 mm nodular density in the inferior right lung field may correspond to nipple shadow or represent a lung nodule. There is no pneumothorax. Mild cardiomegaly. There is moderate stool within the colon. Mildly dilated loops of small bowel noted in the upper and mid abdomen which may represent an ileus versus obstruction. There is air within the colon. No free air noted. Small calcific density over the right renal silhouette may represent renal calculi. Prostate brachytherapy seeds noted. There is degenerative changes of the spine. No acute osseous pathology. IMPRESSION: 1. Small bilateral pleural effusions and bibasilar atelectasis versus infiltrate. 2. Pulmonary nodules. Further evaluation with chest CT on a nonemergent basis recommended. 3. Mildly dilated small bowel loops may represent an ileus versus obstruction. Clinical correlation is recommended. Electronically Signed   By: Anner Crete M.D.   On: 08/05/2017 00:35    Time Spent in minutes  25   Stratton Villwock M.D on 08/05/2017 at 1:37 PM  Between 7am to 7pm - Pager - [phone]  After 7pm go to [email or web address] - password [facility]  [facility] -  Office  [phone]

## 2017-08-05 NOTE — Progress Notes (Signed)
Attempted to Dulcolax suppository rectally. Patient was unable to tolerate and refused suppository.

## 2017-08-05 NOTE — Progress Notes (Signed)
Initial Nutrition Assessment  DOCUMENTATION CODES:     INTERVENTION:  Boost Breeze po TID, each supplement provides 250 kcal and 9 grams of protein    NUTRITION DIAGNOSIS:   Increased nutrient needs related to cancer and cancer related treatments, chronic illness, poor appetite as evidenced by meal completion < 25%, per patient/family report, percent weight loss.  GOAL:   Patient will meet greater than or equal to 90% of their needs   MONITOR:   Diet advancement, PO intake, Supplement acceptance, Labs, Weight trends  REASON FOR ASSESSMENT:   Malnutrition Screening Tool    ASSESSMENT: The patient is a 67 yo male with hx of ETOH abuse, malignant neoplasm of prostate (2014) and cardiology has recommended for transfer to [facility] in case patient requires pericardiocentesis.  Patent being taken out for thoracentesis as RD arrived. His lunch tray is here and untouched. Pateint says he has not had an appetite for several months. Concern for metastatic disease- unknown primary.  His weight hx shows severe loss < 6 months: 04/28/15-231 lb 01/23/16- 211 lb 05/31/16-197 lb 07/30/16- 185 lb 01/21/17- 170.6 lb 03/18/17- 175.6 lb 08/05/17-152.1 lb Patient has weight loss of 18% in 12 months and 13% < 6 months which is severe for latter timeframe. RD returned to talk with patient further this afternoon. MD-Cardiology is with him. He had 360 ml fluid removed.  Patient has oral supplements ordered. Nutrition services will work with him for meal preferences. Strongly expect he is severely malnourished based on current information (significant wt loss and poor oral intake). NFPE- pending. Labs: BMP Latest Ref Rng & Units 08/05/2017 08/04/2017 04/16/2013  Glucose 70 - 99 mg/dL 112(H) 117(H) 109(H)  BUN 8 - 23 mg/dL 59(H) 54(H) -  Creatinine 0.61 - 1.24 mg/dL 2.03(H) 2.16(H) -  Sodium 135 - 145 mmol/L 128(L) 125(L) -  Potassium 3.5 - 5.1 mmol/L 3.7 3.1(L) -  Chloride 98 - 111 mmol/L 94(L) 87(L) -  CO2 22 -  32 mmol/L 18(L) 21(L) -  Calcium 8.9 - 10.3 mg/dL 8.3(L) 8.7(L) -   Medications reviewed and include: dulcolax, folvite, MVI, nicotine patch  NUTRITION - FOCUSED PHYSICAL EXAM: Abbreviated exam- moderate buccal and  temporal fat and muscle loss. RD will complete NFPE next follow up visit.   Diet Order:   Diet Order           Diet clear liquid Room service appropriate? Yes; Fluid consistency: Thin  Diet effective now          EDUCATION NEEDS:   Not appropriate for education at this time Skin:  Skin Assessment: Reviewed RN Assessment  Last BM:  patient constipated per RN- last BM unknown  Height:   Ht Readings from Last 1 Encounters:  08/05/17 5\' 8"  (1.727 m)    Weight:   Wt Readings from Last 1 Encounters:  08/05/17 152 lb 1.9 oz (69 kg)    Ideal Body Weight:  70 kg  BMI:  Body mass index is 23.13 kg/m.  Estimated Nutritional Needs:   Kcal:  9379-0240   Protein:  90-104 gr   Fluid:  >2 liters daily   Colman Cater MS,RD,CSG,LDN Office: #[phone] Pager: [phone]

## 2017-08-05 NOTE — Progress Notes (Signed)
Echo read this afternoon, large pericardial effusion with borderline tamponade physiology. Clinically he is not in cardiac tamponade. Discussed with primary team, full consult to follow. Would recommend transfer to [facility] in case pericardiocentesis becomes neccesary. At this time would continue close monitoring, repeat echo in 24-48 hrs. Does not have clear indication for pericardiocentsis at this moment.     Carlyle Dolly MD

## 2017-08-05 NOTE — ED Notes (Addendum)
Date and time results received: 08/05/17 0205 (use smartphrase ".now" to insert current time)  Test: Lactic Acid Critical Value: 4.2 Name of Provider Notified: Tomi Bamberger  Orders Received? Or Actions Taken?: none at thistime.

## 2017-08-05 NOTE — Care Management Note (Signed)
Case Management Note  Patient Details  Name: [name] [name] MRN: [id number] Date of Birth: [birth year]  Subjective/Objective:                 Admitted with CAP. Pt from home, lives alone normally but has a nephew staying with him currently. His only support person is an ex-wife. His PCP is in [place] and he requests resources for finding a PCP in the [place] area. Pt says he struggles with transportation issues.    Action/Plan: PT recommending SNF. CSW is aware and will make arrangements for placement. CM has provided a list of PCP's accepting pts in [facility].   Expected Discharge Date:       08/11/17           Expected Discharge Plan:  [place]  In-House Referral:  Clinical Social Work  Discharge planning Services  CM Consult  Post Acute Care Choice:  NA Choice offered to:  NA  Status of Service:  Completed, signed off  Sherald Barge, RN 08/05/2017, 10:54 AM

## 2017-08-05 NOTE — ED Notes (Signed)
Date and time results received: 08/05/17 12:14 AM   (use smartphrase ".now" to insert current time)  Test: lactic acid Critical Value: 3.8  Name of Provider Notified: Tomi Bamberger  Orders Received? Or Actions Taken?: see MAR

## 2017-08-05 NOTE — Clinical Social Work Note (Signed)
CSW notified by PT that pt will potentially need SNF at dc.  Met with pt this AM to discuss. Pt informs LCSW that he is not sure that he will need SNF. He says, "I won't know until I'm discharged." Explained that we could start referrals so plan is in place if he needs it and could be cancelled if he didn't. Pt not agreeable to SNF referrals at this time. Left list of local SNF options for pt's reference. Pt has [employer] and would need authorization for SNF placement so if he is stable for dc over the weekend, unlikely that he could be placed until early next week.   Updated RN CM. Will be available if SNF is needed.

## 2017-08-05 NOTE — Progress Notes (Signed)
Thoracentesis complete no signs of distress.  

## 2017-08-05 NOTE — Evaluation (Signed)
Physical Therapy Evaluation Patient Details Name: [name] [name] MRN: [id number] DOB: 03/10/1950 Today's Date: 08/05/2017   History of Present Illness  [name] [name] is a 67 y.o. male with medical history significant for prostate cancer-not currently on treatment, prior history of alcohol abuse, hypertension, anxiety/depression, and essential tremors who presents to the emergency department with vague symptoms of sinus congestion as well as some weakness and disequilibrium.  He also states that he has lost appetite because he cannot have a bowel movement and cannot state when his last bowel movement was.  He is a poor historian, but seems to think that he cannot cough up phlegm and appears to think that he is congested and "clogged up."    Clinical Impression  Patient demonstrates slow labored movement for sitting up at bedside, has to lean on walls, nearby furniture for support when taking steps, unsteady using SPC and limited mostly due to c/o SOB and fatigue.  Patient will benefit from continued physical therapy in hospital and recommended venue below to increase strength, balance, endurance for safe ADLs and gait.    Follow Up Recommendations SNF;Supervision for mobility/OOB    Equipment Recommendations  Rolling walker with 5" wheels    Recommendations for Other Services       Precautions / Restrictions Precautions Precautions: Fall Restrictions Weight Bearing Restrictions: No      Mobility  Bed Mobility Overal bed mobility: Modified Independent                Transfers Overall transfer level: Needs assistance Equipment used: Straight cane Transfers: Sit to/from Stand;Stand Pivot Transfers Sit to Stand: Min assist Stand pivot transfers: Min assist       General transfer comment: slow labored movement with tendency to lean on nearby objects  Ambulation/Gait Ambulation/Gait assistance: Min assist Gait Distance (Feet): 20 Feet Assistive device: Straight  cane Gait Pattern/deviations: Step-to pattern;Decreased step length - right;Decreased step length - left;Decreased stride length Gait velocity: slow   General Gait Details: slow labored cadence with frequent leaning on wall, nearby objects for support, limited mostly due to SOB and fatigue  Stairs            Wheelchair Mobility    Modified Rankin (Stroke Patients Only)       Balance Overall balance assessment: Needs assistance Sitting-balance support: Feet supported;No upper extremity supported Sitting balance-Leahy Scale: Good     Standing balance support: No upper extremity supported;During functional activity Standing balance-Leahy Scale: Poor Standing balance comment: fair with SPC                             Pertinent Vitals/Pain Pain Assessment: No/denies pain    Home Living Family/patient expects to be discharged to:: Private residence Living Arrangements: Non-relatives/Friends(nephew) Available Help at Discharge: Family Type of Home: Apartment Home Access: Level entry     Home Layout: One level Home Equipment: Kasandra Knudsen - single point      Prior Function Level of Independence: Independent         Comments: community ambulator, drives     Journalist, newspaper        Extremity/Trunk Assessment   Upper Extremity Assessment Upper Extremity Assessment: Generalized weakness    Lower Extremity Assessment Lower Extremity Assessment: Generalized weakness    Cervical / Trunk Assessment Cervical / Trunk Assessment: Normal  Communication   Communication: No difficulties  Cognition Arousal/Alertness: Awake/alert Behavior During Therapy: WFL for tasks assessed/performed Overall Cognitive Status: Within Functional Limits  for tasks assessed                                        General Comments      Exercises     Assessment/Plan    PT Assessment Patient needs continued PT services  PT Problem List Decreased activity  tolerance;Decreased balance;Decreased mobility       PT Treatment Interventions Gait training;Stair training;Functional mobility training;Therapeutic activities;Therapeutic exercise;Patient/family education    PT Goals (Current goals can be found in the Care Plan section)  Acute Rehab PT Goals Patient Stated Goal: return home able to walk safely PT Goal Formulation: With patient Time For Goal Achievement: 08/22/17 Potential to Achieve Goals: Good    Frequency Min 3X/week   Barriers to discharge        Co-evaluation               AM-PAC PT "6 Clicks" Daily Activity  Outcome Measure Difficulty turning over in bed (including adjusting bedclothes, sheets and blankets)?: None Difficulty moving from lying on back to sitting on the side of the bed? : None Difficulty sitting down on and standing up from a chair with arms (e.g., wheelchair, bedside commode, etc,.)?: A Little Help needed moving to and from a bed to chair (including a wheelchair)?: A Little Help needed walking in hospital room?: A Lot Help needed climbing 3-5 steps with a railing? : A Lot 6 Click Score: 18    End of Session   Activity Tolerance: Patient tolerated treatment well;Patient limited by fatigue Patient left: in bed;with bed alarm set;with call bell/phone within reach Nurse Communication: Mobility status PT Visit Diagnosis: Unsteadiness on feet (R26.81);Other abnormalities of gait and mobility (R26.89);Muscle weakness (generalized) (M62.81)    Time: 7078-6754 PT Time Calculation (min) (ACUTE ONLY): 23 min   Charges:   PT Evaluation $PT Eval Moderate Complexity: 1 Mod PT Treatments $Therapeutic Activity: 23-37 mins   PT G Codes:        2:10 PM, 08/26/2017 Lonell Grandchild, MPT Physical Therapist with [facility] [phone] office [phone] mobile phone

## 2017-08-05 NOTE — Progress Notes (Signed)
*  PRELIMINARY RESULTS* Echocardiogram 2D Echocardiogram has been performed.  Leavy Cella 08/05/2017, 11:43 AM

## 2017-08-05 NOTE — H&P (Addendum)
History and Physical    [name] [name] TTS:[id number] DOB: April 23, 1950 DOA: 08/04/2017  PCP: [name] Fairly, MD   Patient coming from: Home  Chief Complaint: Sinus congestion/cough  HPI: [name] [name] is a 67 y.o. male with medical history significant for prostate cancer-not currently on treatment, prior history of alcohol abuse, hypertension, anxiety/depression, and essential tremors who presents to the emergency department with vague symptoms of sinus congestion as well as some weakness and disequilibrium.  He also states that he has lost appetite because he cannot have a bowel movement and cannot state when his last bowel movement was.  He is a poor historian, but seems to think that he cannot cough up phlegm and appears to think that he is congested and "clogged up." He denies any fevers, chills, chest pain, dyspnea, lower extremity edema, nausea, vomiting, or diarrhea. He typically follows up with his physicians at [facility]   ED Course: Vital signs are stable.  Laboratory data with some leukocytosis of 13,800.  Sodium at 125 with potassium 3.1.  BUN 54 and creatinine 2.16 with previously noted creatinine of 1.  Lactic acid has up trended from 3.8-4.2 despite administration of 2 L of IV fluid.  Chest x-ray and abdominal film demonstrates some small bilateral effusions with atelectasis versus infiltrate as well as some pulmonary nodules for which CT chest has been recommended.  There is also some questionable small bowel obstruction versus ileus findings on abdominal film.  Review of Systems: All others reviewed and otherwise negative.  Past Medical History:  Diagnosis Date  . Cancer [facility])    Prostate cancer  . Hypertension     History reviewed. No pertinent surgical history.   reports that he has been smoking.  He has been smoking about 1.00 pack per day. He has never used smokeless tobacco. He reports that he drank alcohol. He reports that he does not use drugs.  Allergies    Allergen Reactions  . Bee Venom Anaphylaxis    No family history on file.  Prior to Admission medications   Medication Sig Start Date End Date Taking? Authorizing Provider  amLODipine (NORVASC) 5 MG tablet Take 5 mg by mouth daily. 07/20/17  Yes [provider]  clonazePAM (KLONOPIN) 1 MG tablet Take 1 mg by mouth 3 (three) times daily. 05/09/17  Yes [provider]  cloNIDine (CATAPRES) 0.1 MG tablet Take 0.1 mg by mouth 2 (two) times daily. 07/27/17  Yes [provider]  gabapentin (NEURONTIN) 300 MG capsule Take 300 mg by mouth 2 (two) times daily. 06/20/17  Yes [provider]  oxybutynin (DITROPAN) 5 MG tablet Take 5 mg by mouth 2 (two) times daily.  01/21/17  Yes [provider]  sertraline (ZOLOFT) 100 MG tablet Take 100 mg by mouth daily. 06/15/17  Yes [provider]  tamsulosin (FLOMAX) 0.4 MG CAPS capsule Take 0.4 mg by mouth daily.  07/27/16  Yes [provider]  topiramate (TOPAMAX) 100 MG tablet Take 100 mg by mouth 2 (two) times daily. 06/27/17  Yes [provider]    Physical Exam: Vitals:   08/05/17 0115 08/05/17 0130 08/05/17 0145 08/05/17 0200  BP:    (!) 121/94  Pulse: 82 81 79 81  Resp: 19 19 17 20   Temp:      TempSrc:      SpO2: 99% 100% 99% 99%  Weight:      Height:        Constitutional: NAD, calm, comfortable Vitals:   08/05/17  0115 08/05/17 0130 08/05/17 0145 08/05/17 0200  BP:    (!) 121/94  Pulse: 82 81 79 81  Resp: 19 19 17 20   Temp:      TempSrc:      SpO2: 99% 100% 99% 99%  Weight:      Height:       Eyes: lids and conjunctivae normal ENMT: Mucous membranes are moist.  Neck: normal, supple Respiratory: clear to auscultation bilaterally. Normal respiratory effort. No accessory muscle use.  Cardiovascular: Regular rate and rhythm, no murmurs. No extremity edema. Abdomen: no tenderness, no distention. Bowel sounds positive.  Musculoskeletal:  No joint deformity upper and lower  extremities.   Skin: no rashes, lesions, ulcers.   Labs on Admission: I have personally reviewed following labs and imaging studies  CBC: Recent Labs  Lab 08/04/17 2312  WBC 13.8*  HGB 12.6*  HCT 37.0*  MCV 84.5  PLT 102   Basic Metabolic Panel: Recent Labs  Lab 08/04/17 2312  NA 125*  K 3.1*  CL 87*  CO2 21*  GLUCOSE 117*  BUN 54*  CREATININE 2.16*  CALCIUM 8.7*   GFR: Estimated Creatinine Clearance: 32.4 mL/min (A) (by C-G formula based on SCr of 2.16 mg/dL (H)). Liver Function Tests: Recent Labs  Lab 08/04/17 2312  AST 42*  ALT 20  ALKPHOS 262*  BILITOT 1.6*  PROT 7.3  ALBUMIN 2.9*   No results for input(s): LIPASE, AMYLASE in the last 168 hours. Recent Labs  Lab 08/04/17 2312  AMMONIA 10   Coagulation Profile: No results for input(s): INR, PROTIME in the last 168 hours. Cardiac Enzymes: Recent Labs  Lab 08/04/17 2312  TROPONINI <0.03   BNP (last 3 results) No results for input(s): PROBNP in the last 8760 hours. HbA1C: No results for input(s): HGBA1C in the last 72 hours. CBG: Recent Labs  Lab 08/04/17 2318  GLUCAP 120*   Lipid Profile: No results for input(s): CHOL, HDL, LDLCALC, TRIG, CHOLHDL, LDLDIRECT in the last 72 hours. Thyroid Function Tests: No results for input(s): TSH, T4TOTAL, FREET4, T3FREE, THYROIDAB in the last 72 hours. Anemia Panel: No results for input(s): VITAMINB12, FOLATE, FERRITIN, TIBC, IRON, RETICCTPCT in the last 72 hours. Urine analysis: No results found for: COLORURINE, APPEARANCEUR, LABSPEC, PHURINE, GLUCOSEU, HGBUR, BILIRUBINUR, KETONESUR, PROTEINUR, UROBILINOGEN, NITRITE, LEUKOCYTESUR  Radiological Exams on Admission: Dg Chest 2 View  Result Date: 08/05/2017 CLINICAL DATA:  67 year old male with weakness and constipation. History of prostate cancer. EXAM: ABDOMEN - 2 VIEW; CHEST - 2 VIEW COMPARISON:  None. FINDINGS: There are small bilateral pleural effusions with bibasilar atelectatic changes. Infiltrate is  not excluded. Clinical correlation is recommended. There is an 11 mm pulmonary nodule seen on the lateral view. A 15 mm nodular density in the inferior right lung field may correspond to nipple shadow or represent a lung nodule. There is no pneumothorax. Mild cardiomegaly. There is moderate stool within the colon. Mildly dilated loops of small bowel noted in the upper and mid abdomen which may represent an ileus versus obstruction. There is air within the colon. No free air noted. Small calcific density over the right renal silhouette may represent renal calculi. Prostate brachytherapy seeds noted. There is degenerative changes of the spine. No acute osseous pathology. IMPRESSION: 1. Small bilateral pleural effusions and bibasilar atelectasis versus infiltrate. 2. Pulmonary nodules. Further evaluation with chest CT on a nonemergent basis recommended. 3. Mildly dilated small bowel loops may represent an ileus versus obstruction. Clinical correlation is recommended. Electronically Signed  By: Anner Crete M.D.   On: 08/05/2017 00:35   Ct Head Wo Contrast  Result Date: 08/05/2017 CLINICAL DATA:  Nasal congestion starting a few weeks ago. EXAM: CT HEAD WITHOUT CONTRAST TECHNIQUE: Contiguous axial images were obtained from the base of the skull through the vertex without intravenous contrast. COMPARISON:  None. FINDINGS: Brain: Mild age related involutional changes the brain with chronic appearing small vessel ischemic disease periventricular white matter. No large vascular territory infarct, hemorrhage, intra-axial mass nor extra-axial fluid collections. Midline fourth ventricle and basal cisterns without effacement. No hydrocephalus. Brainstem and cerebellum are nonacute. Vascular: No hyperdense vessel sign. Skull: Normal. Negative for fracture or focal lesion. Sinuses/Orbits: Clear paranasal sinuses and mastoids without significant mucosal thickening. Intact orbits and globes. Other: None IMPRESSION: Age  related involutional changes of the brain with likely chronic small vessel ischemic disease. No acute intracranial abnormality. The included paranasal sinuses are clear. Electronically Signed   By: Ashley Royalty M.D.   On: 08/05/2017 00:23   Dg Abd 2 Views  Result Date: 08/05/2017 CLINICAL DATA:  67 year old male with weakness and constipation. History of prostate cancer. EXAM: ABDOMEN - 2 VIEW; CHEST - 2 VIEW COMPARISON:  None. FINDINGS: There are small bilateral pleural effusions with bibasilar atelectatic changes. Infiltrate is not excluded. Clinical correlation is recommended. There is an 11 mm pulmonary nodule seen on the lateral view. A 15 mm nodular density in the inferior right lung field may correspond to nipple shadow or represent a lung nodule. There is no pneumothorax. Mild cardiomegaly. There is moderate stool within the colon. Mildly dilated loops of small bowel noted in the upper and mid abdomen which may represent an ileus versus obstruction. There is air within the colon. No free air noted. Small calcific density over the right renal silhouette may represent renal calculi. Prostate brachytherapy seeds noted. There is degenerative changes of the spine. No acute osseous pathology. IMPRESSION: 1. Small bilateral pleural effusions and bibasilar atelectasis versus infiltrate. 2. Pulmonary nodules. Further evaluation with chest CT on a nonemergent basis recommended. 3. Mildly dilated small bowel loops may represent an ileus versus obstruction. Clinical correlation is recommended. Electronically Signed   By: Anner Crete M.D.   On: 08/05/2017 00:35    EKG: Independently reviewed. NSR 90bpm.  Assessment/Plan Principal Problem:   Community acquired pneumonia Active Problems:   Prostate cancer ([place])   Essential hypertension   AKI (acute kidney injury) ([place])   Hyponatremia   Hypokalemia   Ileus ([facility])   Lactic acidosis   Pulmonary nodule    1. Bilateral community-acquired pneumonia.   Will treat empirically with azithromycin and Rocephin with antitussives as well as mucolytic's and breathing treatments as needed.  Check urine Legionella as well as strep pneumonia with presence of hyponatremia. 2. Pulmonary nodules with questionable findings on chest x-ray.  Order CT chest for further characterization. 3. Hyponatremia.  Continue on IV fluid with gentle, time-limited normal saline and repeat labs with BMP at noon.  Check urine and serum osmolarities, urine sodium, and TSH. 4. Mild hypokalemia.  Replete orally and recheck a.m. labs also with magnesium. 5. Possible mild ileus versus SBO.  Provide suppository for GI stimulation and replete electrolytes to assist with ileus.  No significant amount of stool noted on abdominal film.  Maintain on regular diet for now. 6. Lactic acidosis.  This is likely related to a combination of dehydration and possibly his pneumonia.  Continue IV fluid hydration and trend.  He does not have multiple sirs  criteria or indication for sepsis at this time. 7. AKI.  Avoid nephrotoxic agents and maintain on IV fluid and recheck renal panel.  Should improve with IV hydration as he seems dehydrated. 8. Generalized weakness.  PT evaluation. 9. Tobacco abuse.  Cessation counseling.  Nicotine patch. 10. Essential hypertension.  Maintain on home amlodipine and clonidine. 11. Anxiety/depression.  Maintain on Zoloft and Klonopin. 12. Tremors.  Maintain on Topamax. 13. Prostate cancer history.  States that current PSA levels are undetectable and has not been on chemoradiation.  Followed at [facility]. Maintain on Flomax and Ditropan. Assess for urinary retention if output is poor.  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  2D echocardiogram ordered for further evaluation of the effusion.   DVT prophylaxis: Heparin Code Status: Full Family Communication: None at bedside; lives with Nephew Disposition Plan:Electrolyte  correction, pneumonia treatment Consults called:None Admission status: Inpatient, Med-Surg   [name] [name] Darleen Crocker DO [facility] Pager [phone]  If 7PM-7AM, please contact night-coverage [email or web address] Password [email or web address]  08/05/2017, 2:34 AM

## 2017-08-05 NOTE — Consult Note (Signed)
Cardiology Consultation:   Patient ID: [name] [name]; [id number]; [birth year]   Admit date: 08/04/2017 Date of Consult: 08/05/2017  Primary Care Provider: [name], MD Primary Cardiologist: new, Branch Primary Electrophysiologist:  na   Patient Profile:   [name] [name] is a 67 y.o. male with a hx of prostate cancer, HTN, anxiety depression who is being seen today for the evaluation of pericardial effusion at the request of Dr Clementeen Graham  History of Present Illness:   [name] [name] 67 yo male history of prostate cancer, HTN, anxiety/depression admitted with cough and SOB. As part of workup has been found to have multiple medical issues including hyponatermia, possible pneumonia, AKI, lung nodules with diffuse metastases, and a large pleural and pericardial effusion. Cardiology consulted regarding the pericardial effusion. Patient reports stable mild to moderate SOB. No chest pain. He is not tachycardic or hypotensive, in no distress.    WBC 13.8 Hgb 12.6 Plt 236 Na 125 K 3.1 Cr 2.16 Lactic 3.8 Mg 2.5 Mg 2.5  CT head no acute process Abx xray: dilated loops of bowel CXR small bilateral effusions, bibasilar atelectasis vs infiltrate CT chest bilateral pulmonary nodules, mediastinal and retroperitoneal adenopathy with hepatic lesions and omental/peritonal involvement Trop neg  Past Medical History:  Diagnosis Date  . Cancer [facility])    Prostate cancer  . Hypertension     History reviewed. No pertinent surgical history.    Inpatient Medications: Scheduled Meds: . amLODipine  5 mg Oral Daily  . bisacodyl  10 mg Rectal Once  . chlorhexidine  15 mL Mouth Rinse BID  . clonazePAM  1 mg Oral TID  . cloNIDine  0.1 mg Oral BID  . feeding supplement  1 Container Oral TID BM  . fluticasone  1 spray Each Nare Daily  . folic acid  1 mg Oral Daily  . gabapentin  300 mg Oral BID  . heparin  5,000 Units Subcutaneous Q8H  . mouth rinse  15 mL Mouth Rinse q12n4p  . multivitamin with minerals   1 tablet Oral Daily  . [START ON 08/06/2017] nicotine  21 mg Transdermal Daily  . oxybutynin  5 mg Oral BID  . sertraline  100 mg Oral Daily  . tamsulosin  0.4 mg Oral Daily  . thiamine  100 mg Oral Daily   Or  . thiamine  100 mg Intravenous Daily  . topiramate  100 mg Oral BID   Continuous Infusions: . sodium chloride 75 mL/hr at 08/05/17 0434  . [START ON 08/06/2017] azithromycin    . [START ON 08/06/2017] cefTRIAXone (ROCEPHIN)  IV     PRN Meds: acetaminophen **OR** acetaminophen, guaiFENesin-dextromethorphan, ipratropium-albuterol, LORazepam **OR** LORazepam, ondansetron **OR** ondansetron (ZOFRAN) IV  Allergies:    Allergies  Allergen Reactions  . Bee Venom Anaphylaxis    Social History:   Social History   Socioeconomic History  . Marital status: Married    Spouse name: Not on file  . Number of children: Not on file  . Years of education: Not on file  . Highest education level: Not on file  Occupational History  . Not on file  Social Needs  . Financial resource strain: Not on file  . Food insecurity:    Worry: Not on file    Inability: Not on file  . Transportation needs:    Medical: Not on file    Non-medical: Not on file  Tobacco Use  . Smoking status: Current Every Day Smoker    Packs/day: 1.00  . Smokeless tobacco: Never Used  Substance and Sexual Activity  . Alcohol use: Not Currently  . Drug use: Never  . Sexual activity: Not on file  Lifestyle  . Physical activity:    Days per week: Not on file    Minutes per session: Not on file  . Stress: Not on file  Relationships  . Social connections:    Talks on phone: Not on file    Gets together: Not on file    Attends religious service: Not on file    Active member of club or organization: Not on file    Attends meetings of clubs or organizations: Not on file    Relationship status: Not on file  . Intimate partner violence:    Fear of current or ex partner: Not on file    Emotionally abused: Not on  file    Physically abused: Not on file    Forced sexual activity: Not on file  Other Topics Concern  . Not on file  Social History Narrative  . Not on file    Family History:   History reviewed. No pertinent family history.   ROS:  Please see the history of present illness.   All other ROS reviewed and negative.     Physical Exam/Data:   Vitals:   08/05/17 0200 08/05/17 0348 08/05/17 0613 08/05/17 1149  BP: (!) 121/94 (!) 132/100 114/87 (!) 121/92  Pulse: 81 88 90 84  Resp: 20 18 18 16   Temp:  97.7 F (36.5 C)  (!) 97.5 F (36.4 C)  TempSrc:  Oral  Oral  SpO2: 99% 99% 100% 100%  Weight:  152 lb 1.9 oz (69 kg)    Height:  5\' 8"  (1.727 m)      Intake/Output Summary (Last 24 hours) at 08/05/2017 1314 Last data filed at 08/05/2017 0927 Gross per 24 hour  Intake 425 ml  Output -  Net 425 ml   Filed Weights   08/04/17 2135 08/05/17 0348  Weight: 150 lb (68 kg) 152 lb 1.9 oz (69 kg)   Body mass index is 23.13 kg/m.  General:  Well nourished, well developed, in no acute distress HEENT: normal Lymph: no adenopathy Neck: no JVD Cardiac:  normal S1, S2; RRR; no murmur  Lungs:  clear to auscultation bilaterally, no wheezing, rhonchi or rales  Abd: soft, nontender, no hepatomegaly  Ext: no edema Musculoskeletal:  No deformities, BUE and BLE strength normal and equal Skin: warm and dry  Neuro:  CNs 2-12 intact, no focal abnormalities noted Psych:  Normal affect    Laboratory Data:  Chemistry Recent Labs  Lab 08/04/17 2312 08/05/17 1209  NA 125* 128*  K 3.1* 3.7  CL 87* 94*  CO2 21* 18*  GLUCOSE 117* 112*  BUN 54* 59*  CREATININE 2.16* 2.03*  CALCIUM 8.7* 8.3*  GFRNONAA 30* 32*  GFRAA 35* 38*  ANIONGAP 17* 16*    Recent Labs  Lab 08/04/17 2312  PROT 7.3  ALBUMIN 2.9*  AST 42*  ALT 20  ALKPHOS 262*  BILITOT 1.6*   Hematology Recent Labs  Lab 08/04/17 2312  WBC 13.8*  RBC 4.38  HGB 12.6*  HCT 37.0*  MCV 84.5  MCH 28.8  MCHC 34.1  RDW  14.2  PLT 236   Cardiac Enzymes Recent Labs  Lab 08/04/17 2312  TROPONINI <0.03   No results for input(s): TROPIPOC in the last 168 hours.  BNPNo results for input(s): BNP, PROBNP in the last 168 hours.  DDimer No results for input(s):  DDIMER in the last 168 hours.  Radiology/Studies:  Dg Chest 2 View  Result Date: 08/05/2017 CLINICAL DATA:  67 year old male with weakness and constipation. History of prostate cancer. EXAM: ABDOMEN - 2 VIEW; CHEST - 2 VIEW COMPARISON:  None. FINDINGS: There are small bilateral pleural effusions with bibasilar atelectatic changes. Infiltrate is not excluded. Clinical correlation is recommended. There is an 11 mm pulmonary nodule seen on the lateral view. A 15 mm nodular density in the inferior right lung field may correspond to nipple shadow or represent a lung nodule. There is no pneumothorax. Mild cardiomegaly. There is moderate stool within the colon. Mildly dilated loops of small bowel noted in the upper and mid abdomen which may represent an ileus versus obstruction. There is air within the colon. No free air noted. Small calcific density over the right renal silhouette may represent renal calculi. Prostate brachytherapy seeds noted. There is degenerative changes of the spine. No acute osseous pathology. IMPRESSION: 1. Small bilateral pleural effusions and bibasilar atelectasis versus infiltrate. 2. Pulmonary nodules. Further evaluation with chest CT on a nonemergent basis recommended. 3. Mildly dilated small bowel loops may represent an ileus versus obstruction. Clinical correlation is recommended. Electronically Signed   By: Anner Crete M.D.   On: 08/05/2017 00:35   Ct Head Wo Contrast  Result Date: 08/05/2017 CLINICAL DATA:  Nasal congestion starting a few weeks ago. EXAM: CT HEAD WITHOUT CONTRAST TECHNIQUE: Contiguous axial images were obtained from the base of the skull through the vertex without intravenous contrast. COMPARISON:  None. FINDINGS:  Brain: Mild age related involutional changes the brain with chronic appearing small vessel ischemic disease periventricular white matter. No large vascular territory infarct, hemorrhage, intra-axial mass nor extra-axial fluid collections. Midline fourth ventricle and basal cisterns without effacement. No hydrocephalus. Brainstem and cerebellum are nonacute. Vascular: No hyperdense vessel sign. Skull: Normal. Negative for fracture or focal lesion. Sinuses/Orbits: Clear paranasal sinuses and mastoids without significant mucosal thickening. Intact orbits and globes. Other: None IMPRESSION: Age related involutional changes of the brain with likely chronic small vessel ischemic disease. No acute intracranial abnormality. The included paranasal sinuses are clear. Electronically Signed   By: Ashley Royalty M.D.   On: 08/05/2017 00:23   Ct Chest Wo Contrast  Result Date: 08/05/2017 CLINICAL DATA:  67 year old male with pulmonary nodules and enlarged lymph nodes. EXAM: CT CHEST WITHOUT CONTRAST TECHNIQUE: Multidetector CT imaging of the chest was performed following the standard protocol without IV contrast. COMPARISON:  Chest radiograph dated 08/04/2017 FINDINGS: Evaluation of this exam is limited in the absence of intravenous contrast. Cardiovascular: There is no cardiomegaly. There is a large pericardial effusion measuring approximately 3 cm in thickness. Eighth cardia may provide better evaluation of the pericardial effusion and assessment for cardiac ejection fraction. There is mild atherosclerotic calcification of the thoracic aorta. The central pulmonary arteries are grossly unremarkable on this noncontrast CT. Mediastinum/Nodes: There are multiple enlarged mediastinal lymph nodes measuring up to 2.3 cm in the right paratracheal region and 3 cm in the subcarinal node. Esophagus is grossly unremarkable. Small and heterogeneous thyroid gland with possible tiny hypodense nodules and small calcific foci. Ultrasound may  provide better evaluation of the thyroid gland if clinically indicated. Lungs/Pleura: There are small bilateral pleural effusions with associated partial compressive atelectasis of the lower lobes. Pneumonia is not excluded. Multiple bilateral pulmonary nodules measure up to 14 mm at the right lung base and 10 mm in the left upper lobe concerning for metastatic disease given findings of adenopathy  and pleural effusion. There is no pneumothorax. There is centrilobular and paraseptal emphysema. The central airways are patent. Upper Abdomen: There is a small ascites. Two hypodense lesions in the right lobe of the liver measure up to 21 x 18 mm suspicious for metastatic disease. Partially visualized gallstones. Retroperitoneal adenopathy measure up to 2.4 cm to the left of the aorta. There are areas of nodularity in the upper mesentery and omentum concerning for metastatic implant. Areas of nodularity along the diaphragms bilaterally also concerning for peritoneal implants. Musculoskeletal: No chest wall mass or suspicious bone lesions identified. IMPRESSION: 1. Bilateral pulmonary nodules, mediastinal and retroperitoneal adenopathy, hepatic lesions, omental and peritoneal implants, and bilateral pleural effusions and ascites. The constellation of findings most consistent with metastatic disease of indeterminate primary. Further evaluation with CT of the abdomen and pelvis with oral and IV contrast on a nonemergent basis recommended. Sampling of the pleural effusion or ascites may provide diagnostic cytology. 2. Large pericardial effusion measuring 3 cm in thickness. Echocardiogram may provide better evaluation of the pericardial effusion and cardiac function. These results were called by telephone at the time of interpretation on 08/05/2017 at 3:45 am to nurse Marcello Moores , who verbally acknowledged these results. Electronically Signed   By: Anner Crete M.D.   On: 08/05/2017 03:49   Dg Abd 2 Views  Result Date:  08/05/2017 CLINICAL DATA:  67 year old male with weakness and constipation. History of prostate cancer. EXAM: ABDOMEN - 2 VIEW; CHEST - 2 VIEW COMPARISON:  None. FINDINGS: There are small bilateral pleural effusions with bibasilar atelectatic changes. Infiltrate is not excluded. Clinical correlation is recommended. There is an 11 mm pulmonary nodule seen on the lateral view. A 15 mm nodular density in the inferior right lung field may correspond to nipple shadow or represent a lung nodule. There is no pneumothorax. Mild cardiomegaly. There is moderate stool within the colon. Mildly dilated loops of small bowel noted in the upper and mid abdomen which may represent an ileus versus obstruction. There is air within the colon. No free air noted. Small calcific density over the right renal silhouette may represent renal calculi. Prostate brachytherapy seeds noted. There is degenerative changes of the spine. No acute osseous pathology. IMPRESSION: 1. Small bilateral pleural effusions and bibasilar atelectasis versus infiltrate. 2. Pulmonary nodules. Further evaluation with chest CT on a nonemergent basis recommended. 3. Mildly dilated small bowel loops may represent an ileus versus obstruction. Clinical correlation is recommended. Electronically Signed   By: Anner Crete M.D.   On: 08/05/2017 00:35    Assessment and Plan:   1. Pericardial effusion - in setting probable diffuse metastatic disease on CT newly dagnosed this admission, effusion probably related to malignancy, unknown primary - he is for thoracentesis today, hopefully cytology will be diagnosis - large effusion with borderline tamponade physiology, he is not in clinical tamponade with normal heart rates, bp's, and no distress. Requires close monitoring, repeat echo in 48 hours limited or if clinical change - if intervention neccesary may need to consider pericardial window given likely malignancy if patient is a candidate.  - given size of  effusion and potential need for intervention I have recommended patient be transferred to [facility]. At this time in absence of clinical tamponade and mixed echo findings emergent intervention not indicated    2. Lung nodules - diffuse probable metastatic cancer noted on imaging this admission - workup per primary team.    3. AKI - baseline from 2015 was 1.11 Admit Cr 2.16  and BUN 54. Mild improvement with IVFs  4.Hyponatremia - Na trending up with IVFs. Possible hypovolemia as cause. With possible metastatic lung CA other possibilities include paraneoplastic syndrome - per primary team   5. Possible ileus - noted on abdominal films, patient complains of constipation      For questions or updates, please contact [place] Please consult [email or web address] for contact info under Cardiology/STEMI.   Merrily Pew, MD  08/05/2017 1:14 PM

## 2017-08-05 NOTE — Procedures (Signed)
PreOperative Dx: LEFT pleural effusion Postoperative Dx: LEFT pleural effusion Procedure:   US guided LEFT thoracentesis Radiologist:  Thornton Papas Anesthesia:  10 ml of 1% lidocaine Specimen:  360 L of yellow colored fluid EBL:   < 1 ml Complications: None

## 2017-08-05 NOTE — Plan of Care (Signed)
  Problem: Acute Rehab PT Goals(only PT should resolve) Goal: Patient Will Transfer Sit To/From Stand Outcome: Progressing Flowsheets (Taken 08/05/2017 1411) Patient will transfer sit to/from stand: with supervision Goal: Pt Will Transfer Bed To Chair/Chair To Bed Outcome: Progressing Flowsheets (Taken 08/05/2017 1411) Pt will Transfer Bed to Chair/Chair to Bed: with supervision Goal: Pt Will Ambulate Outcome: Progressing Flowsheets (Taken 08/05/2017 1411) Pt will Ambulate: 50 feet;with supervision   2:12 PM, 08/05/17 Lonell Grandchild, MPT Physical Therapist with [facility] [phone] office [phone] mobile phone

## 2017-08-05 NOTE — Progress Notes (Signed)
Report received from AP RN. Pt to be transferred to [facility] via [place].

## 2017-08-06 LAB — RAPID URINE DRUG SCREEN, HOSP PERFORMED
Amphetamines: NOT DETECTED
BARBITURATES: NOT DETECTED
Benzodiazepines: NOT DETECTED
COCAINE: NOT DETECTED
OPIATES: NOT DETECTED
Tetrahydrocannabinol: NOT DETECTED

## 2017-08-06 LAB — ANA: ANA: NEGATIVE

## 2017-08-06 LAB — BASIC METABOLIC PANEL
ANION GAP: 17 — AB (ref 5–15)
BUN: 66 mg/dL — ABNORMAL HIGH (ref 8–23)
CALCIUM: 8.5 mg/dL — AB (ref 8.9–10.3)
CO2: 18 mmol/L — AB (ref 22–32)
Chloride: 92 mmol/L — ABNORMAL LOW (ref 98–111)
Creatinine, Ser: 2.1 mg/dL — ABNORMAL HIGH (ref 0.61–1.24)
GFR calc Af Amer: 36 mL/min — ABNORMAL LOW (ref >60)
GFR calc non Af Amer: 31 mL/min — ABNORMAL LOW (ref >60)
Glucose, Bld: 98 mg/dL (ref 70–99)
Potassium: 3.8 mmol/L (ref 3.5–5.1)
Sodium: 127 mmol/L — ABNORMAL LOW (ref 135–145)

## 2017-08-06 LAB — SODIUM, URINE, RANDOM: Sodium, Ur: <10 mmol/L

## 2017-08-06 LAB — STREP PNEUMONIAE URINARY ANTIGEN: Strep Pneumo Urinary Antigen: NEGATIVE

## 2017-08-06 LAB — OSMOLALITY, URINE: OSMOLALITY UR: 622 mosm/kg (ref 300–900)

## 2017-08-06 LAB — AMYLASE, PLEURAL OR PERITONEAL FLUID: AMYLASE FL: 11 U/L

## 2017-08-06 LAB — HIV ANTIBODY (ROUTINE TESTING W REFLEX): HIV SCREEN 4TH GENERATION: NONREACTIVE

## 2017-08-06 MED ORDER — SODIUM CHLORIDE 0.9 % IV SOLN
INTRAVENOUS | Status: DC
  Administered 2017-08-06: 01:00:00 via INTRAVENOUS

## 2017-08-06 MED ORDER — LACTATED RINGERS IV SOLN
INTRAVENOUS | Status: DC
  Administered 2017-08-06 – 2017-08-09 (×9): via INTRAVENOUS

## 2017-08-06 MED ORDER — DM-GUAIFENESIN ER 30-600 MG PO TB12
1.0000 | ORAL_TABLET | Freq: Two times a day (BID) | ORAL | Status: DC
  Administered 2017-08-06 – 2017-08-09 (×5): 1 via ORAL
  Filled 2017-08-06 (×8): qty 1

## 2017-08-06 NOTE — Progress Notes (Signed)
Pt c/o sinuses congestion, MD notified.

## 2017-08-06 NOTE — Progress Notes (Signed)
PROGRESS NOTE    [name] [name]  KWI:[id number] DOB: 1951-01-17 DOA: 08/04/2017 PCP: [name], MD    Brief Narrative:  67 year old male with history of prostate cancer (reports getting radiation therapy), history of alcohol abuse (reports not having a drink for past few weeks, used to drink at least 6 packs of beer daily), heavy smoker, hypertension, anxiety/depression and essential tremors who presented to the ED with sinus congestion, weakness and unsteady gait for past few weeks which has gotten worse in the past few days.  He also reports very poor appetite and per his ex-wife at bedside he has lost probably about 100 pounds since he last saw him 3-4 months ago.  Patient is a poor historian.  He complained that he was having some sinus congestion and cough being unable to produce any phlegm.  He also reported some shortness of breath.  He denied any fevers, chills, chest pain, orthopnea, PND, nausea, vomiting, abdominal pain, dysuria, diarrhea, tingling or numbness of his extremities.  In the ED his vitals were stable but labs showed WBC of 13 point 8K hemoglobin of 12.6, platelets of 236, sodium of 125, potassium of 3.1, AKI with BUN of 54 and creatinine of 2.16 calcium of 8.7, AST of 42, alkaline phosphatase of 262, bilirubin of 1.6 and lactic acid of 4.2 which subsequently improved to 3.2.  Her chest x-ray done showed small bilateral pleural effusion and bibasilar atelectasis versus infiltrate along with pulmonary nodules.  Bowel loops concerning for ileus. CT of the head was done which was negative for acute findings.  CT of the chest without contrast given pulmonary nodules showed bilateral pulmonary nodules with mediastinal  and retroperitoneal adenopathy my hepatic lesions, omental and peritoneal implants and bilateral pleural effusion along with ascites.  Findings are concerning for metastatic disease of unknown primary.  Also found to have large pericardial effusion measuring 3 cm in  thickness.  Patient admitted to telemetry for further management.  2D echo done showed large pericardial effusion with mild tamponade physiology.  Discussed with cardiology with plan to transfer to [facility] for close monitoring.  Assessment & Plan:   Principal Problem:   Community acquired pneumonia Active Problems:   Prostate cancer ([place])   Essential hypertension   AKI (acute kidney injury) ([place])   Hyponatremia   Hypokalemia   Ileus ([facility])   Lactic acidosis   Pulmonary nodule   Pneumonia  Principal problem Pericardial effusion -2D echo with findings of large pericardial effusion with borderline tamponade physiology.  Clinically no signs of cardiac tamponade.   -Patient was transferred to [facility] from [place] for closer monitoring -Cardiology is following -Currently on minimal O2 support. Without sob or chest pains -Pathology from pleural fluid is pending -Per Cardiology, possibility for pericardial window on repeat 2d echo 7/22  Pulmonary nodules with bilateral pleural effusions and possible pneumonia -Patient is continued on empiric Rocephin and azithromycin. -Remains on minimal O2 requirements as per above -patient is s/p thoracentesis yielding just over 300cc fluid, cytology pending  Diffuse metastatic disease with unknown primary.   -Patient with bilateral pulmonary, retroperitoneal, mediastinal lymph nodes with hepatic lesions suggestive of diffuse metastatic disease of unknown primary. -May need Oncology consultation pending cytology results -Continuing gentle IVF hydration. Would benefit from formal metastatic survey once renal function further improves  Acute kidney injury -Suspect prerenal.   -Seems to be responsive to IVF thereapy -Will resume gentle IVF hydration -Repeat bmet in AM  Hyponatremia -Possible prerenal versus SIADH with pneumonia/malignancy.  -  Improvement noted with hydration.   -Will continue gentle IVF  hydration  Elevated lactic acid -Clinically not septic -Improved  Tobacco abuse -Counseled strongly on cessation. Done at bedside -Smokes 1/2 pack/day.  -Continue nicotine patch  Alcohol abuse -No evidence of withdrawals -Patient is continued on CIWA.  Anxiety and depression -appears stable at present -Would continue current anxiolytics   DVT prophylaxis: heparin subq Code Status: Full Family Communication: Pt in room, family not at bedside Disposition Plan: Uncertain at this time  Consultants:   Cardiology  IR  Procedures:   Thoracentesis 7/19  Antimicrobials: Anti-infectives (From admission, onward)   Start     Dose/Rate Route Frequency Ordered Stop   08/06/17 0200  cefTRIAXone (ROCEPHIN) 1 g in sodium chloride 0.9 % 100 mL IVPB     1 g 200 mL/hr over 30 Minutes Intravenous Every 24 hours 08/05/17 0257     08/06/17 0200  azithromycin (ZITHROMAX) 250 mg in dextrose 5 % 125 mL IVPB  Status:  Discontinued     250 mg 125 mL/hr over 60 Minutes Intravenous Every 24 hours 08/05/17 0257 08/05/17 1333   08/06/17 0200  azithromycin (ZITHROMAX) 250 mg in sodium chloride 0.9 % 100 mL injection      Intravenous Every 24 hours 08/05/17 1334     08/05/17 0115  cefTRIAXone (ROCEPHIN) 1 g in sodium chloride 0.9 % 100 mL IVPB     1 g 200 mL/hr over 30 Minutes Intravenous  Once 08/05/17 0113 08/05/17 0244   08/05/17 0115  azithromycin (ZITHROMAX) 500 mg in sodium chloride 0.9 % 250 mL IVPB     500 mg 250 mL/hr over 60 Minutes Intravenous  Once 08/05/17 0113 08/05/17 0317       Subjective: Without SOB. No chest pains. No complaints  Objective: Vitals:   08/05/17 2014 08/05/17 2202 08/06/17 0511 08/06/17 1200  BP: (!) 127/94 118/70 115/87 108/77  Pulse: 89  76 72  Resp: 18  18 18   Temp:   97.9 F (36.6 C) 98.5 F (36.9 C)  TempSrc:    Oral  SpO2: 100%  99% 98%  Weight:   69 kg (152 lb 1.9 oz)   Height:        Intake/Output Summary (Last 24 hours) at  08/06/2017 1448 Last data filed at 08/06/2017 1445 Gross per 24 hour  Intake 1427.34 ml  Output 300 ml  Net 1127.34 ml   Filed Weights   08/05/17 0348 08/05/17 2011 08/06/17 0511  Weight: 69 kg (152 lb 1.9 oz) 69 kg (152 lb 1.9 oz) 69 kg (152 lb 1.9 oz)    Examination:  General exam: Appears calm and comfortable  Respiratory system: Clear to auscultation. Respiratory effort normal. Cardiovascular system: S1 & S2 heard, RRR Gastrointestinal system: Abdomen is nondistended, soft and nontender. No organomegaly or masses felt. Normal bowel sounds heard. Central nervous system: Alert and oriented. No focal neurological deficits. Extremities: Symmetric 5 x 5 power. Skin: No rashes, lesions Psychiatry: Judgement and insight appear normal. Mood & affect appropriate.   Data Reviewed: I have personally reviewed following labs and imaging studies  CBC: Recent Labs  Lab 08/04/17 2312  WBC 13.8*  HGB 12.6*  HCT 37.0*  MCV 84.5  PLT 122   Basic Metabolic Panel: Recent Labs  Lab 08/04/17 2312 08/05/17 1209 08/06/17 0836  NA 125* 128* 127*  K 3.1* 3.7 3.8  CL 87* 94* 92*  CO2 21* 18* 18*  GLUCOSE 117* 112* 98  BUN 54* 59* 66*  CREATININE 2.16* 2.03* 2.10*  CALCIUM 8.7* 8.3* 8.5*  MG 2.5*  --   --    GFR: Estimated Creatinine Clearance: 33.5 mL/min (A) (by C-G formula based on SCr of 2.1 mg/dL (H)). Liver Function Tests: Recent Labs  Lab 08/04/17 2312  AST 42*  ALT 20  ALKPHOS 262*  BILITOT 1.6*  PROT 7.3  ALBUMIN 2.9*   No results for input(s): LIPASE, AMYLASE in the last 168 hours. Recent Labs  Lab 08/04/17 2312  AMMONIA 10   Coagulation Profile: No results for input(s): INR, PROTIME in the last 168 hours. Cardiac Enzymes: Recent Labs  Lab 08/04/17 2312  TROPONINI <0.03   BNP (last 3 results) No results for input(s): PROBNP in the last 8760 hours. HbA1C: No results for input(s): HGBA1C in the last 72 hours. CBG: Recent Labs  Lab 08/04/17 2318   GLUCAP 120*   Lipid Profile: No results for input(s): CHOL, HDL, LDLCALC, TRIG, CHOLHDL, LDLDIRECT in the last 72 hours. Thyroid Function Tests: Recent Labs    08/04/17 2312  TSH 2.510   Anemia Panel: No results for input(s): VITAMINB12, FOLATE, FERRITIN, TIBC, IRON, RETICCTPCT in the last 72 hours. Sepsis Labs: Recent Labs  Lab 08/04/17 2312 08/05/17 0122 08/05/17 0631  LATICACIDVEN 3.8* 4.2* 3.2*    Recent Results (from the past 240 hour(s))  Culture, body fluid-bottle     Status: None (Preliminary result)   Collection Time: 08/05/17  1:31 PM  Result Value Ref Range Status   Specimen Description ASCITIC  Final   Special Requests BOTTLES DRAWN AEROBIC AND ANAEROBIC 10 CC EACH  Final   Culture   Final    NO GROWTH < 24 HOURS Performed at [facility], [address] [place], [place] [address]    Report Status PENDING  Incomplete  Gram stain     Status: None   Collection Time: 08/05/17  1:31 PM  Result Value Ref Range Status   Specimen Description PLEURAL  Final   Special Requests NONE  Final   Gram Stain   Final    CYTOSPIN SMEAR NO ORGANISMS SEEN WBC PRESENT, PREDOMINANTLY MONONUCLEAR Performed at [facility], [address], [place], [place] [address]    Report Status 08/05/2017 FINAL  Final     Radiology Studies: Dg Chest 1 View  Result Date: 08/05/2017 CLINICAL DATA:  BILATERAL pleural effusions and pericardial effusion, post diagnostic LEFT thoracentesis EXAM: CHEST  1 VIEW COMPARISON:  CT chest 08/05/2017, chest radiograph 08/04/2017 FINDINGS: Enlargement of cardiac silhouette, known pericardial effusion by CT. Mediastinal contours and pulmonary vascularity normal. Probable bibasilar effusions. No pneumothorax following LEFT thoracentesis. Upper lungs clear. Mild atelectasis versus infiltrate at RIGHT base. Probable RIGHT nipple shadow. Bones demineralized. IMPRESSION: No pneumothorax following LEFT thoracentesis. Electronically Signed   By: Lavonia Dana M.D.    On: 08/05/2017 13:48   Dg Chest 2 View  Result Date: 08/05/2017 CLINICAL DATA:  67 year old male with weakness and constipation. History of prostate cancer. EXAM: ABDOMEN - 2 VIEW; CHEST - 2 VIEW COMPARISON:  None. FINDINGS: There are small bilateral pleural effusions with bibasilar atelectatic changes. Infiltrate is not excluded. Clinical correlation is recommended. There is an 11 mm pulmonary nodule seen on the lateral view. A 15 mm nodular density in the inferior right lung field may correspond to nipple shadow or represent a lung nodule. There is no pneumothorax. Mild cardiomegaly. There is moderate stool within the colon. Mildly dilated loops of small bowel noted in the upper and mid abdomen which may  represent an ileus versus obstruction. There is air within the colon. No free air noted. Small calcific density over the right renal silhouette may represent renal calculi. Prostate brachytherapy seeds noted. There is degenerative changes of the spine. No acute osseous pathology. IMPRESSION: 1. Small bilateral pleural effusions and bibasilar atelectasis versus infiltrate. 2. Pulmonary nodules. Further evaluation with chest CT on a nonemergent basis recommended. 3. Mildly dilated small bowel loops may represent an ileus versus obstruction. Clinical correlation is recommended. Electronically Signed   By: Anner Crete M.D.   On: 08/05/2017 00:35   Ct Head Wo Contrast  Result Date: 08/05/2017 CLINICAL DATA:  Nasal congestion starting a few weeks ago. EXAM: CT HEAD WITHOUT CONTRAST TECHNIQUE: Contiguous axial images were obtained from the base of the skull through the vertex without intravenous contrast. COMPARISON:  None. FINDINGS: Brain: Mild age related involutional changes the brain with chronic appearing small vessel ischemic disease periventricular white matter. No large vascular territory infarct, hemorrhage, intra-axial mass nor extra-axial fluid collections. Midline fourth ventricle and basal  cisterns without effacement. No hydrocephalus. Brainstem and cerebellum are nonacute. Vascular: No hyperdense vessel sign. Skull: Normal. Negative for fracture or focal lesion. Sinuses/Orbits: Clear paranasal sinuses and mastoids without significant mucosal thickening. Intact orbits and globes. Other: None IMPRESSION: Age related involutional changes of the brain with likely chronic small vessel ischemic disease. No acute intracranial abnormality. The included paranasal sinuses are clear. Electronically Signed   By: Ashley Royalty M.D.   On: 08/05/2017 00:23   Ct Chest Wo Contrast  Result Date: 08/05/2017 CLINICAL DATA:  67 year old male with pulmonary nodules and enlarged lymph nodes. EXAM: CT CHEST WITHOUT CONTRAST TECHNIQUE: Multidetector CT imaging of the chest was performed following the standard protocol without IV contrast. COMPARISON:  Chest radiograph dated 08/04/2017 FINDINGS: Evaluation of this exam is limited in the absence of intravenous contrast. Cardiovascular: There is no cardiomegaly. There is a large pericardial effusion measuring approximately 3 cm in thickness. Eighth cardia may provide better evaluation of the pericardial effusion and assessment for cardiac ejection fraction. There is mild atherosclerotic calcification of the thoracic aorta. The central pulmonary arteries are grossly unremarkable on this noncontrast CT. Mediastinum/Nodes: There are multiple enlarged mediastinal lymph nodes measuring up to 2.3 cm in the right paratracheal region and 3 cm in the subcarinal node. Esophagus is grossly unremarkable. Small and heterogeneous thyroid gland with possible tiny hypodense nodules and small calcific foci. Ultrasound may provide better evaluation of the thyroid gland if clinically indicated. Lungs/Pleura: There are small bilateral pleural effusions with associated partial compressive atelectasis of the lower lobes. Pneumonia is not excluded. Multiple bilateral pulmonary nodules measure up to  14 mm at the right lung base and 10 mm in the left upper lobe concerning for metastatic disease given findings of adenopathy and pleural effusion. There is no pneumothorax. There is centrilobular and paraseptal emphysema. The central airways are patent. Upper Abdomen: There is a small ascites. Two hypodense lesions in the right lobe of the liver measure up to 21 x 18 mm suspicious for metastatic disease. Partially visualized gallstones. Retroperitoneal adenopathy measure up to 2.4 cm to the left of the aorta. There are areas of nodularity in the upper mesentery and omentum concerning for metastatic implant. Areas of nodularity along the diaphragms bilaterally also concerning for peritoneal implants. Musculoskeletal: No chest wall mass or suspicious bone lesions identified. IMPRESSION: 1. Bilateral pulmonary nodules, mediastinal and retroperitoneal adenopathy, hepatic lesions, omental and peritoneal implants, and bilateral pleural effusions and ascites. The constellation  of findings most consistent with metastatic disease of indeterminate primary. Further evaluation with CT of the abdomen and pelvis with oral and IV contrast on a nonemergent basis recommended. Sampling of the pleural effusion or ascites may provide diagnostic cytology. 2. Large pericardial effusion measuring 3 cm in thickness. Echocardiogram may provide better evaluation of the pericardial effusion and cardiac function. These results were called by telephone at the time of interpretation on 08/05/2017 at 3:45 am to nurse Marcello Moores , who verbally acknowledged these results. Electronically Signed   By: Anner Crete M.D.   On: 08/05/2017 03:49   Dg Abd 2 Views  Result Date: 08/05/2017 CLINICAL DATA:  67 year old male with weakness and constipation. History of prostate cancer. EXAM: ABDOMEN - 2 VIEW; CHEST - 2 VIEW COMPARISON:  None. FINDINGS: There are small bilateral pleural effusions with bibasilar atelectatic changes. Infiltrate is not excluded.  Clinical correlation is recommended. There is an 11 mm pulmonary nodule seen on the lateral view. A 15 mm nodular density in the inferior right lung field may correspond to nipple shadow or represent a lung nodule. There is no pneumothorax. Mild cardiomegaly. There is moderate stool within the colon. Mildly dilated loops of small bowel noted in the upper and mid abdomen which may represent an ileus versus obstruction. There is air within the colon. No free air noted. Small calcific density over the right renal silhouette may represent renal calculi. Prostate brachytherapy seeds noted. There is degenerative changes of the spine. No acute osseous pathology. IMPRESSION: 1. Small bilateral pleural effusions and bibasilar atelectasis versus infiltrate. 2. Pulmonary nodules. Further evaluation with chest CT on a nonemergent basis recommended. 3. Mildly dilated small bowel loops may represent an ileus versus obstruction. Clinical correlation is recommended. Electronically Signed   By: Anner Crete M.D.   On: 08/05/2017 00:35   US Thoracentesis Asp Pleural Space W/img Guide  Result Date: 08/05/2017 INDICATION: BILATERAL pleural effusions and pericardial effusion, for diagnostic thoracentesis EXAM: ULTRASOUND GUIDED DIAGNOSTIC LEFT THORACENTESIS MEDICATIONS: None. COMPLICATIONS: None immediate. PROCEDURE: Procedure, benefits, and risks of procedure were discussed with patient. Written informed consent for procedure was obtained. Time out protocol followed. Pleural effusion localized by ultrasound at the posterior LEFT hemithorax. Skin prepped and draped in usual sterile fashion. Skin and soft tissues anesthetized with 10 mL of 1% lidocaine. 8 French thoracentesis catheter placed into the LEFT pleural space. 360 mL of yellow LEFT pleural fluid aspirated by syringe pump. Procedure tolerated well by patient without immediate complication. FINDINGS: A total of approximately 360 mL of LEFT pleural fluid was removed.  Samples were sent to the laboratory as requested by the clinical team. IMPRESSION: Successful ultrasound guided diagnostic LEFT thoracentesis yielding 360 mL of pleural fluid. Electronically Signed   By: Lavonia Dana M.D.   On: 08/05/2017 14:08    Scheduled Meds: . amLODipine  5 mg Oral Daily  . bisacodyl  10 mg Rectal Once  . chlorhexidine  15 mL Mouth Rinse BID  . clonazePAM  1 mg Oral TID  . cloNIDine  0.1 mg Oral BID  . feeding supplement  1 Container Oral TID BM  . fluticasone  1 spray Each Nare Daily  . folic acid  1 mg Oral Daily  . gabapentin  300 mg Oral BID  . heparin  5,000 Units Subcutaneous Q8H  . mouth rinse  15 mL Mouth Rinse q12n4p  . multivitamin with minerals  1 tablet Oral Daily  . nicotine  21 mg Transdermal Daily  . oxybutynin  5 mg Oral BID  . sertraline  100 mg Oral Daily  . tamsulosin  0.4 mg Oral Daily  . thiamine  100 mg Oral Daily   Or  . thiamine  100 mg Intravenous Daily  . topiramate  100 mg Oral BID   Continuous Infusions: . sodium chloride 10 mL/hr at 08/06/17 1051  . small volume/piggyback builder Stopped (08/06/17 0404)  . cefTRIAXone (ROCEPHIN)  IV Stopped (08/06/17 0248)  . lactated ringers 75 mL/hr at 08/06/17 1210     LOS: 1 day   Marylu Lund, MD [facility] Pager [phone]  If 7PM-7AM, please contact night-coverage [email or web address] Password [facility] 08/06/2017, 2:48 PM

## 2017-08-06 NOTE — Progress Notes (Signed)
Pt arrived to [facility]. Alert and oriented x 4, VS stable, no signs of acute distress, denied chest pain. Pt identified appropriately, orders reviewed. Cardiac monitor placed on pt and CCMD notified.  Pt oriented to room and equipment, bed alarm activated. Pt instructed to call for assistance, verbalized understanding of how to use call bell and call bell left within reach. Will continue to monitor and treat pt per MD orders.

## 2017-08-06 NOTE — Progress Notes (Signed)
Subjective:  Transferred from [place] with large pericardial effusion.  Also has pleural effusion.left effusion was tapped yesterday.  Currently not short of breath.  Objective:  Vital Signs in the last 24 hours: BP 115/87 (BP Location: Left Arm)   Pulse 76   Temp 97.9 F (36.6 C)   Resp 18   Ht 5\' 8"  (1.727 m)   Wt 69 kg (152 lb 1.9 oz)   SpO2 99%   BMI 23.13 kg/m   Physical Exam: Chronically ill-appearing male in no acute distress lying in bed Lungs:  Clear Cardiac:  Regular rhythm, normal S1 and S2, no S3, 1/6 murmur Extremities:  No edema present  Intake/Output from previous day: 07/19 0701 - 07/20 0700 In: 1279.6 [P.O.:360; I.V.:819.6; IV Piggyback:100] Out: 300 [Urine:300]  Weight Filed Weights   08/05/17 0348 08/05/17 2011 08/06/17 0511  Weight: 69 kg (152 lb 1.9 oz) 69 kg (152 lb 1.9 oz) 69 kg (152 lb 1.9 oz)    Lab Results: Basic Metabolic Panel: Recent Labs    08/05/17 1209 08/06/17 0836  NA 128* 127*  K 3.7 3.8  CL 94* 92*  CO2 18* 18*  GLUCOSE 112* 98  BUN 59* 66*  CREATININE 2.03* 2.10*   CBC: Recent Labs    08/04/17 2312  WBC 13.8*  HGB 12.6*  HCT 37.0*  MCV 84.5  PLT 236   Cardiac Panel (last 3 results) Recent Labs    08/04/17 2312  TROPONINI <0.03    Telemetry: Sinus rhythm rate of 70  Assessment/Plan:  1.  Large pericardial effusion and borderline criteria for tamponade by echo although neck veins not distended and not tachycardic 2.  Metastatic cancer 3.  Pleural effusion cytology pending 4. Acute on chronic renal failure  Recommendations:  Repeat echo on Monday to assess size of the effusion.  Depending on cytology of pleural fluid may need window to prevent tamponade down the road.       Kerry Hough  MD [facility]  08/06/2017, 11:34 AM

## 2017-08-07 DIAGNOSIS — J90 Pleural effusion, not elsewhere classified: Secondary | ICD-10-CM

## 2017-08-07 DIAGNOSIS — J189 Pneumonia, unspecified organism: Secondary | ICD-10-CM

## 2017-08-07 DIAGNOSIS — E878 Other disorders of electrolyte and fluid balance, not elsewhere classified: Secondary | ICD-10-CM

## 2017-08-07 LAB — COMPREHENSIVE METABOLIC PANEL
ALT: 33 U/L (ref 0–44)
AST: 72 U/L — ABNORMAL HIGH (ref 15–41)
Albumin: 2.4 g/dL — ABNORMAL LOW (ref 3.5–5.0)
Alkaline Phosphatase: 258 U/L — ABNORMAL HIGH (ref 38–126)
Anion gap: 17 — ABNORMAL HIGH (ref 5–15)
BUN: 71 mg/dL — AB (ref 8–23)
CHLORIDE: 93 mmol/L — AB (ref 98–111)
CO2: 17 mmol/L — AB (ref 22–32)
Calcium: 8.5 mg/dL — ABNORMAL LOW (ref 8.9–10.3)
Creatinine, Ser: 2.06 mg/dL — ABNORMAL HIGH (ref 0.61–1.24)
GFR calc Af Amer: 37 mL/min — ABNORMAL LOW (ref >60)
GFR, EST NON AFRICAN AMERICAN: 32 mL/min — AB (ref >60)
Glucose, Bld: 92 mg/dL (ref 70–99)
Potassium: 3.8 mmol/L (ref 3.5–5.1)
SODIUM: 127 mmol/L — AB (ref 135–145)
Total Bilirubin: 1.3 mg/dL — ABNORMAL HIGH (ref 0.3–1.2)
Total Protein: 6.4 g/dL — ABNORMAL LOW (ref 6.5–8.1)

## 2017-08-07 LAB — CBC
HEMATOCRIT: 34.8 % — AB (ref 39.0–52.0)
Hemoglobin: 11.2 g/dL — ABNORMAL LOW (ref 13.0–17.0)
MCH: 27.9 pg (ref 26.0–34.0)
MCHC: 32.2 g/dL (ref 30.0–36.0)
MCV: 86.8 fL (ref 78.0–100.0)
Platelets: 168 10*3/uL (ref 150–400)
RBC: 4.01 MIL/uL — ABNORMAL LOW (ref 4.22–5.81)
RDW: 14.4 % (ref 11.5–15.5)
WBC: 12.1 10*3/uL — ABNORMAL HIGH (ref 4.0–10.5)

## 2017-08-07 LAB — URINALYSIS, ROUTINE W REFLEX MICROSCOPIC
BILIRUBIN URINE: NEGATIVE
Glucose, UA: NEGATIVE mg/dL
Ketones, ur: 5 mg/dL — AB
Leukocytes, UA: NEGATIVE
Nitrite: NEGATIVE
PROTEIN: NEGATIVE mg/dL
Specific Gravity, Urine: 1.02 (ref 1.005–1.030)
pH: 5 (ref 5.0–8.0)

## 2017-08-07 LAB — PH, BODY FLUID: PH, BODY FLUID: 7.7

## 2017-08-07 LAB — LEGIONELLA PNEUMOPHILA SEROGP 1 UR AG: L. pneumophila Serogp 1 Ur Ag: NEGATIVE

## 2017-08-07 MED ORDER — BISACODYL 5 MG PO TBEC
5.0000 mg | DELAYED_RELEASE_TABLET | Freq: Every day | ORAL | Status: DC | PRN
  Administered 2017-08-07: 5 mg via ORAL
  Filled 2017-08-07: qty 1

## 2017-08-07 NOTE — Progress Notes (Signed)
Pt c/o abdominal pain, per pt related to constipation. MD notified.  New order for Dulcolax placed.

## 2017-08-07 NOTE — Progress Notes (Addendum)
Progress Note  Patient Name: [name] [name] Date of Encounter: 08/07/2017  Primary Cardiologist: Carlyle Dolly, MD   Subjective   Breathing is unchanged. No chest pain  Inpatient Medications    Scheduled Meds: . amLODipine  5 mg Oral Daily  . bisacodyl  10 mg Rectal Once  . chlorhexidine  15 mL Mouth Rinse BID  . cloNIDine  0.1 mg Oral BID  . dextromethorphan-guaiFENesin  1 tablet Oral BID  . feeding supplement  1 Container Oral TID BM  . fluticasone  1 spray Each Nare Daily  . folic acid  1 mg Oral Daily  . heparin  5,000 Units Subcutaneous Q8H  . mouth rinse  15 mL Mouth Rinse q12n4p  . multivitamin with minerals  1 tablet Oral Daily  . nicotine  21 mg Transdermal Daily  . oxybutynin  5 mg Oral BID  . sertraline  100 mg Oral Daily  . tamsulosin  0.4 mg Oral Daily  . thiamine  100 mg Oral Daily   Or  . thiamine  100 mg Intravenous Daily  . topiramate  100 mg Oral BID   Continuous Infusions: . sodium chloride 10 mL/hr at 08/06/17 1051  . small volume/piggyback builder 200 mL/hr at 08/07/17 0251  . cefTRIAXone (ROCEPHIN)  IV Stopped (08/07/17 0236)  . lactated ringers 100 mL/hr at 08/07/17 1017   PRN Meds: acetaminophen **OR** acetaminophen, bisacodyl, guaiFENesin-dextromethorphan, ipratropium-albuterol, LORazepam **OR** LORazepam, ondansetron **OR** ondansetron (ZOFRAN) IV   Vital Signs    Vitals:   08/06/17 2057 08/07/17 0004 08/07/17 0525 08/07/17 1018  BP: 106/69 104/79 103/79 108/77  Pulse: 86 77 72   Resp: 12 16 16    Temp: 97.6 F (36.4 C) 97.7 F (36.5 C) 97.6 F (36.4 C)   TempSrc: Oral Oral    SpO2: 94% 99% 99%   Weight:      Height:        Intake/Output Summary (Last 24 hours) at 08/07/2017 1056 Last data filed at 08/07/2017 0745 Gross per 24 hour  Intake 1634.75 ml  Output 200 ml  Net 1434.75 ml   Filed Weights   08/05/17 0348 08/05/17 2011 08/06/17 0511  Weight: 152 lb 1.9 oz (69 kg) 152 lb 1.9 oz (69 kg) 152 lb 1.9 oz (69 kg)     Telemetry    NSR without significant ST-T wave changes - Personally Reviewed  ECG    No new EKG - Personally Reviewed  Physical Exam   GEN: No acute distress.   Neck: No JVD Cardiac: RRR, no murmurs, rubs, or gallops.  Respiratory: Clear to auscultation bilaterally. GI: Soft, nontender, non-distended  MS: No edema; No deformity. Neuro:  Nonfocal  Psych: Normal affect   Labs    Chemistry Recent Labs  Lab 08/04/17 2312 08/05/17 1209 08/06/17 0836 08/07/17 0342  NA 125* 128* 127* 127*  K 3.1* 3.7 3.8 3.8  CL 87* 94* 92* 93*  CO2 21* 18* 18* 17*  GLUCOSE 117* 112* 98 92  BUN 54* 59* 66* 71*  CREATININE 2.16* 2.03* 2.10* 2.06*  CALCIUM 8.7* 8.3* 8.5* 8.5*  PROT 7.3  --   --  6.4*  ALBUMIN 2.9*  --   --  2.4*  AST 42*  --   --  72*  ALT 20  --   --  33  ALKPHOS 262*  --   --  258*  BILITOT 1.6*  --   --  1.3*  GFRNONAA 30* 32* 31* 32*  GFRAA 35* 38* 36*  37*  ANIONGAP 17* 16* 17* 17*     Hematology Recent Labs  Lab 08/04/17 2312 08/07/17 0342  WBC 13.8* 12.1*  RBC 4.38 4.01*  HGB 12.6* 11.2*  HCT 37.0* 34.8*  MCV 84.5 86.8  MCH 28.8 27.9  MCHC 34.1 32.2  RDW 14.2 14.4  PLT 236 168    Cardiac Enzymes Recent Labs  Lab 08/04/17 2312  TROPONINI <0.03   No results for input(s): TROPIPOC in the last 168 hours.   BNPNo results for input(s): BNP, PROBNP in the last 168 hours.   DDimer No results for input(s): DDIMER in the last 168 hours.   Radiology    Dg Chest 1 View  Result Date: 08/05/2017 CLINICAL DATA:  BILATERAL pleural effusions and pericardial effusion, post diagnostic LEFT thoracentesis EXAM: CHEST  1 VIEW COMPARISON:  CT chest 08/05/2017, chest radiograph 08/04/2017 FINDINGS: Enlargement of cardiac silhouette, known pericardial effusion by CT. Mediastinal contours and pulmonary vascularity normal. Probable bibasilar effusions. No pneumothorax following LEFT thoracentesis. Upper lungs clear. Mild atelectasis versus infiltrate at RIGHT  base. Probable RIGHT nipple shadow. Bones demineralized. IMPRESSION: No pneumothorax following LEFT thoracentesis. Electronically Signed   By: Lavonia Dana M.D.   On: 08/05/2017 13:48   US Thoracentesis Asp Pleural Space W/img Guide  Result Date: 08/05/2017 INDICATION: BILATERAL pleural effusions and pericardial effusion, for diagnostic thoracentesis EXAM: ULTRASOUND GUIDED DIAGNOSTIC LEFT THORACENTESIS MEDICATIONS: None. COMPLICATIONS: None immediate. PROCEDURE: Procedure, benefits, and risks of procedure were discussed with patient. Written informed consent for procedure was obtained. Time out protocol followed. Pleural effusion localized by ultrasound at the posterior LEFT hemithorax. Skin prepped and draped in usual sterile fashion. Skin and soft tissues anesthetized with 10 mL of 1% lidocaine. 8 French thoracentesis catheter placed into the LEFT pleural space. 360 mL of yellow LEFT pleural fluid aspirated by syringe pump. Procedure tolerated well by patient without immediate complication. FINDINGS: A total of approximately 360 mL of LEFT pleural fluid was removed. Samples were sent to the laboratory as requested by the clinical team. IMPRESSION: Successful ultrasound guided diagnostic LEFT thoracentesis yielding 360 mL of pleural fluid. Electronically Signed   By: Lavonia Dana M.D.   On: 08/05/2017 14:08    Cardiac Studies   Echo 08/05/2017 LV EF: 50% -   55% Study Conclusions  - Left ventricle: The cavity size was normal. Wall thickness was   increased in a pattern of moderate LVH. Systolic function was   normal. The estimated ejection fraction was in the range of 50%   to 55%. Wall motion was normal; there were no regional wall   motion abnormalities. Doppler parameters are consistent with   abnormal left ventricular relaxation (grade 1 diastolic   dysfunction). Doppler parameters are consistent with high   ventricular filling pressure. - Aortic valve: Mildly calcified annulus. Trileaflet;  mildly   thickened leaflets. Valve area (VTI): 2.89 cm^2. Valve area   (Vmax): 2.24 cm^2. - Mitral valve: Mildly calcified annulus. Mildly thickened leaflets   . - Inferior vena cava: The vessel was normal in size. The   respirophasic diameter changes were in the normal range (= 50%),   consistent with normal central venous pressure. - Pericardium, extracardiac: There is a large circumferential   pericardial effusion, most prominent adjacent to the RV measuring   2.7 cm in diastole. Respiratory variation across the MV is   approximately 34%. Variation across TV is not interpretable.   There is buckling of the RA without frank collapse. No clear RV  collapse. The IVC appears normal measuring 1.9 cm, with normal   variation. (Not seen on 2D but noted on M Mode) Overall mixed   criteria for tamponade physiology. - Technically adequate study. - Recommend repeat echo in 24 to 48 hours to reassess effusion.  Patient Profile     67 y.o. male who was found to have AKI, multiple nodules on CT concerning for metastatic disease of unknown primary source, and large pericardial effusion.   Assessment & Plan    1. Pericardial effusion  - in the setting of probable diffuse metastatic disease on CT  - large pericardial effusion with borderline tamponade physiology.  - plan for repeat limited echo tomorrow. If continue to enlarge, may need pericardial window given possible metastatic disease  - BP borderline, stop clonidine  2. Pleural effusion: underwent thoracentesis on 08/05/2017 with removal of 360 ml of yellow colored fluid.   3. Lung nodule: newly diagnosed, likely metastatic cancer of unknown primary source.   4. AKI: slowly improving  5. Hyponatremia  6. Possible ileus: noted on abdominal films.    For questions or updates, please contact [place] Please consult [email or web address] for contact info under Cardiology/STEMI.      Hilbert Corrigan, PA  08/07/2017, 10:56 AM      Patient seen and examined, discussed with PA. Agree with note as above.  Patient without chest pain, breathing is stable. On exam, frail appearing. no JVD or lower extremity edema. BP has been stable. Heart sounds distant but regular, no tachycardia. Did not appreciate murmur today. Labs and studies reviewed.  Clinically not consistent with tamponade. However, given his blood pressure would stop clonidine today. If still on the low side could stop amlodipine tomorrow. Pleural effusion with mesothelial and reactive mesothelial cells, awaiting word on potential malignancy.  Will plan on repeat limited echo tomorrow to assess effusion. Once final pathology back on fluid, will need to discuss whether pericardial window would be of benefit (depending on cause, and therefore likelihood of effusion recurrence).  TIME SPENT WITH PATIENT: 25 minutes of direct patient care. More than 50% of that time was spent on coordination of care and counseling regarding pericardial effusion.  Buford Dresser, MD, PhD [facility]

## 2017-08-07 NOTE — Progress Notes (Signed)
Informed Dr. Wyline Copas that patient is complaining of increased SOB when sitting up O2 sats are stable. Per MD continue to monitor

## 2017-08-07 NOTE — Progress Notes (Signed)
PROGRESS NOTE    [name] [name]  WUJ:[id number] DOB: [birth year] DOA: 08/04/2017 PCP: [name], MD    Brief Narrative:  67 year old male with history of prostate cancer (reports getting radiation therapy), history of alcohol abuse (reports not having a drink for past few weeks, used to drink at least 6 packs of beer daily), heavy smoker, hypertension, anxiety/depression and essential tremors who presented to the ED with sinus congestion, weakness and unsteady gait for past few weeks which has gotten worse in the past few days.  He also reports very poor appetite and per his ex-wife at bedside he has lost probably about 100 pounds since he last saw him 3-4 months ago.  Patient is a poor historian.  He complained that he was having some sinus congestion and cough being unable to produce any phlegm.  He also reported some shortness of breath.  He denied any fevers, chills, chest pain, orthopnea, PND, nausea, vomiting, abdominal pain, dysuria, diarrhea, tingling or numbness of his extremities.  In the ED his vitals were stable but labs showed WBC of 13 point 8K hemoglobin of 12.6, platelets of 236, sodium of 125, potassium of 3.1, AKI with BUN of 54 and creatinine of 2.16 calcium of 8.7, AST of 42, alkaline phosphatase of 262, bilirubin of 1.6 and lactic acid of 4.2 which subsequently improved to 3.2.  Her chest x-ray done showed small bilateral pleural effusion and bibasilar atelectasis versus infiltrate along with pulmonary nodules.  Bowel loops concerning for ileus. CT of the head was done which was negative for acute findings.  CT of the chest without contrast given pulmonary nodules showed bilateral pulmonary nodules with mediastinal  and retroperitoneal adenopathy my hepatic lesions, omental and peritoneal implants and bilateral pleural effusion along with ascites.  Findings are concerning for metastatic disease of unknown primary.  Also found to have large pericardial effusion measuring 3 cm in  thickness.  Patient admitted to telemetry for further management.  2D echo done showed large pericardial effusion with mild tamponade physiology.  Discussed with cardiology with plan to transfer to [facility] for close monitoring.  Assessment & Plan:   Principal Problem:   Community acquired pneumonia Active Problems:   Prostate cancer ([place])   Essential hypertension   AKI (acute kidney injury) ([place])   Hyponatremia   Hypokalemia   Ileus ([facility])   Lactic acidosis   Pulmonary nodule   Pneumonia   Pleural effusion  Principal problem Pericardial effusion -2D echo with findings of large pericardial effusion with borderline tamponade physiology.  Clinically no signs of cardiac tamponade.   -Patient was transferred to [facility] from [place] for closer monitoring -Cardiology is following. Recommendation for repeat 2d echo 7/22 -Currently on minimal O2 support. Without sob or chest pains -Awaiting pleural fluid cytology  Pulmonary nodules with bilateral pleural effusions and possible pneumonia -Patient is continued on empiric Rocephin and azithromycin. -presently on minimal O2 requirements as per above -patient is s/p thoracentesis yielding just over 300cc fluid, cytology currently pending  Diffuse metastatic disease with unknown primary.   -Patient with bilateral pulmonary, retroperitoneal, mediastinal lymph nodes with hepatic lesions suggestive of diffuse metastatic disease of unknown primary. -Will likely require Oncology consultation pending cytology results -Patient is currently continued on gentle IVF hydration. Would benefit from formal metastatic survey once renal function further improves  Acute kidney injury -Suspect prerenal.   -Continuing gentle IVF hydration. Renal function improved -Recheck bmet in AM  Hyponatremia -Possible prerenal versus SIADH with pneumonia/malignancy.  -Improvement noted  with hydration.   -Will continue gentle IVF  hydration  Elevated lactic acid -Clinically not septic -Improved, labs reviewed  Tobacco abuse -Counseled strongly on cessation. Done at bedside -Smokes 1/2 pack/day.  -will continue on nicotine patch  Alcohol abuse -No evidence of withdrawals this morning -Patient is continued on CIWA.  Anxiety and depression -appears stable at present -Patient reports not tolerating klonopin prior to admit and had not been taking -Will d/c Klonopin    DVT prophylaxis: heparin subq Code Status: Full Family Communication: Pt in room, family not at bedside Disposition Plan: Uncertain at this time  Consultants:   Cardiology  IR  Procedures:   Thoracentesis 7/19  Antimicrobials: Anti-infectives (From admission, onward)   Start     Dose/Rate Route Frequency Ordered Stop   08/06/17 0200  cefTRIAXone (ROCEPHIN) 1 g in sodium chloride 0.9 % 100 mL IVPB     1 g 200 mL/hr over 30 Minutes Intravenous Every 24 hours 08/05/17 0257     08/06/17 0200  azithromycin (ZITHROMAX) 250 mg in dextrose 5 % 125 mL IVPB  Status:  Discontinued     250 mg 125 mL/hr over 60 Minutes Intravenous Every 24 hours 08/05/17 0257 08/05/17 1333   08/06/17 0200  azithromycin (ZITHROMAX) 250 mg in sodium chloride 0.9 % 100 mL injection      Intravenous Every 24 hours 08/05/17 1334     08/05/17 0115  cefTRIAXone (ROCEPHIN) 1 g in sodium chloride 0.9 % 100 mL IVPB     1 g 200 mL/hr over 30 Minutes Intravenous  Once 08/05/17 0113 08/05/17 0244   08/05/17 0115  azithromycin (ZITHROMAX) 500 mg in sodium chloride 0.9 % 250 mL IVPB     500 mg 250 mL/hr over 60 Minutes Intravenous  Once 08/05/17 0113 08/05/17 0317      Subjective: Without complaints at present  Objective: Vitals:   08/07/17 0525 08/07/17 1018 08/07/17 1200 08/07/17 1411  BP: 103/79 108/77  103/72  Pulse: 72   75  Resp: 16   16  Temp: 97.6 F (36.4 C)  (!) 97 F (36.1 C) 97.7 F (36.5 C)  TempSrc:    Oral  SpO2: 99%   99%  Weight:       Height:        Intake/Output Summary (Last 24 hours) at 08/07/2017 1518 Last data filed at 08/07/2017 1507 Gross per 24 hour  Intake 1619.53 ml  Output 200 ml  Net 1419.53 ml   Filed Weights   08/05/17 0348 08/05/17 2011 08/06/17 0511  Weight: 69 kg (152 lb 1.9 oz) 69 kg (152 lb 1.9 oz) 69 kg (152 lb 1.9 oz)    Examination: General exam: Conversant, in no acute distress Respiratory system: normal chest rise, clear, no audible wheezing Cardiovascular system: regular rhythm, s1-s2 Gastrointestinal system: Nondistended, nontender, pos BS Central nervous system: No seizures, no tremors Extremities: No cyanosis, no joint deformities Skin: No rashes, no pallor Psychiatry: Affect normal // no auditory hallucinations    Data Reviewed: I have personally reviewed following labs and imaging studies  CBC: Recent Labs  Lab 08/04/17 2312 08/07/17 0342  WBC 13.8* 12.1*  HGB 12.6* 11.2*  HCT 37.0* 34.8*  MCV 84.5 86.8  PLT 236 161   Basic Metabolic Panel: Recent Labs  Lab 08/04/17 2312 08/05/17 1209 08/06/17 0836 08/07/17 0342  NA 125* 128* 127* 127*  K 3.1* 3.7 3.8 3.8  CL 87* 94* 92* 93*  CO2 21* 18* 18* 17*  GLUCOSE 117* 112*  98 92  BUN 54* 59* 66* 71*  CREATININE 2.16* 2.03* 2.10* 2.06*  CALCIUM 8.7* 8.3* 8.5* 8.5*  MG 2.5*  --   --   --    GFR: Estimated Creatinine Clearance: 34.1 mL/min (A) (by C-G formula based on SCr of 2.06 mg/dL (H)). Liver Function Tests: Recent Labs  Lab 08/04/17 2312 08/07/17 0342  AST 42* 72*  ALT 20 33  ALKPHOS 262* 258*  BILITOT 1.6* 1.3*  PROT 7.3 6.4*  ALBUMIN 2.9* 2.4*   No results for input(s): LIPASE, AMYLASE in the last 168 hours. Recent Labs  Lab 08/04/17 2312  AMMONIA 10   Coagulation Profile: No results for input(s): INR, PROTIME in the last 168 hours. Cardiac Enzymes: Recent Labs  Lab 08/04/17 2312  TROPONINI <0.03   BNP (last 3 results) No results for input(s): PROBNP in the last 8760 hours. HbA1C: No  results for input(s): HGBA1C in the last 72 hours. CBG: Recent Labs  Lab 08/04/17 2318  GLUCAP 120*   Lipid Profile: No results for input(s): CHOL, HDL, LDLCALC, TRIG, CHOLHDL, LDLDIRECT in the last 72 hours. Thyroid Function Tests: Recent Labs    08/04/17 2312  TSH 2.510   Anemia Panel: No results for input(s): VITAMINB12, FOLATE, FERRITIN, TIBC, IRON, RETICCTPCT in the last 72 hours. Sepsis Labs: Recent Labs  Lab 08/04/17 2312 08/05/17 0122 08/05/17 0631  LATICACIDVEN 3.8* 4.2* 3.2*    Recent Results (from the past 240 hour(s))  Culture, body fluid-bottle     Status: None (Preliminary result)   Collection Time: 08/05/17  1:31 PM  Result Value Ref Range Status   Specimen Description ASCITIC  Final   Special Requests BOTTLES DRAWN AEROBIC AND ANAEROBIC 10 CC EACH  Final   Culture   Final    NO GROWTH 2 DAYS Performed at [facility], [address], [place], [place] [address]    Report Status PENDING  Incomplete  Gram stain     Status: None   Collection Time: 08/05/17  1:31 PM  Result Value Ref Range Status   Specimen Description PLEURAL  Final   Special Requests NONE  Final   Gram Stain   Final    CYTOSPIN SMEAR NO ORGANISMS SEEN WBC PRESENT, PREDOMINANTLY MONONUCLEAR Performed at [facility], [address], [place], [place] [address]    Report Status 08/05/2017 FINAL  Final     Radiology Studies: No results found.  Scheduled Meds: . amLODipine  5 mg Oral Daily  . bisacodyl  10 mg Rectal Once  . chlorhexidine  15 mL Mouth Rinse BID  . dextromethorphan-guaiFENesin  1 tablet Oral BID  . feeding supplement  1 Container Oral TID BM  . fluticasone  1 spray Each Nare Daily  . folic acid  1 mg Oral Daily  . heparin  5,000 Units Subcutaneous Q8H  . mouth rinse  15 mL Mouth Rinse q12n4p  . multivitamin with minerals  1 tablet Oral Daily  . nicotine  21 mg Transdermal Daily  . oxybutynin  5 mg Oral BID  . sertraline  100 mg Oral Daily  . tamsulosin  0.4 mg  Oral Daily  . thiamine  100 mg Oral Daily  . topiramate  100 mg Oral BID   Continuous Infusions: . sodium chloride 10 mL/hr at 08/06/17 1051  . small volume/piggyback builder Stopped (08/07/17 0700)  . cefTRIAXone (ROCEPHIN)  IV Stopped (08/07/17 0236)  . lactated ringers 100 mL/hr at 08/07/17 1427     LOS: 2 days  Marylu Lund, MD [facility] Pager [phone]  If 7PM-7AM, please contact night-coverage [email or web address] Password [facility] 08/07/2017, 3:18 PM

## 2017-08-08 ENCOUNTER — Inpatient Hospital Stay (HOSPITAL_COMMUNITY): Payer: Medicare HMO | Admitting: Certified Registered"

## 2017-08-08 ENCOUNTER — Encounter (HOSPITAL_COMMUNITY)
Admission: EM | Disposition: A | Discharge status: Hospice | Payer: Self-pay | Source: Home / Self Care | Attending: Internal Medicine

## 2017-08-08 ENCOUNTER — Inpatient Hospital Stay (HOSPITAL_COMMUNITY): Payer: Medicare HMO

## 2017-08-08 ENCOUNTER — Encounter (HOSPITAL_COMMUNITY): Payer: Self-pay | Admitting: Thoracic Surgery (Cardiothoracic Vascular Surgery)

## 2017-08-08 DIAGNOSIS — N179 Acute kidney failure, unspecified: Secondary | ICD-10-CM

## 2017-08-08 DIAGNOSIS — I313 Pericardial effusion (noninflammatory): Secondary | ICD-10-CM

## 2017-08-08 DIAGNOSIS — I10 Essential (primary) hypertension: Secondary | ICD-10-CM

## 2017-08-08 DIAGNOSIS — I314 Cardiac tamponade: Secondary | ICD-10-CM | POA: Diagnosis present

## 2017-08-08 DIAGNOSIS — E871 Hypo-osmolality and hyponatremia: Secondary | ICD-10-CM

## 2017-08-08 HISTORY — PX: SUBXYPHOID PERICARDIAL WINDOW: SHX5075

## 2017-08-08 HISTORY — PX: TEE WITHOUT CARDIOVERSION: SHX5443

## 2017-08-08 LAB — COMPREHENSIVE METABOLIC PANEL
ALK PHOS: 271 U/L — AB (ref 38–126)
ALT: 37 U/L (ref 0–44)
AST: 65 U/L — ABNORMAL HIGH (ref 15–41)
Albumin: 2.4 g/dL — ABNORMAL LOW (ref 3.5–5.0)
Anion gap: 16 — ABNORMAL HIGH (ref 5–15)
BILIRUBIN TOTAL: 1.2 mg/dL (ref 0.3–1.2)
BUN: 74 mg/dL — ABNORMAL HIGH (ref 8–23)
CALCIUM: 8.7 mg/dL — AB (ref 8.9–10.3)
CO2: 17 mmol/L — ABNORMAL LOW (ref 22–32)
Chloride: 95 mmol/L — ABNORMAL LOW (ref 98–111)
Creatinine, Ser: 2.06 mg/dL — ABNORMAL HIGH (ref 0.61–1.24)
GFR, EST AFRICAN AMERICAN: 37 mL/min — AB (ref >60)
GFR, EST NON AFRICAN AMERICAN: 32 mL/min — AB (ref >60)
Glucose, Bld: 90 mg/dL (ref 70–99)
Potassium: 3.8 mmol/L (ref 3.5–5.1)
Sodium: 128 mmol/L — ABNORMAL LOW (ref 135–145)
TOTAL PROTEIN: 5.7 g/dL — AB (ref 6.5–8.1)

## 2017-08-08 LAB — ACID FAST SMEAR (AFB, MYCOBACTERIA): Acid Fast Smear: NEGATIVE

## 2017-08-08 LAB — BODY FLUID CELL COUNT WITH DIFFERENTIAL
Lymphs, Fluid: 4 %
Monocyte-Macrophage-Serous Fluid: 5 % — ABNORMAL LOW (ref 50–90)
Neutrophil Count, Fluid: 91 % — ABNORMAL HIGH (ref 0–25)
Total Nucleated Cell Count, Fluid: 2590 cu mm — ABNORMAL HIGH (ref 0–1000)

## 2017-08-08 LAB — PROTIME-INR
INR: 1.26
PROTHROMBIN TIME: 15.7 s — AB (ref 11.4–15.2)

## 2017-08-08 LAB — ECHOCARDIOGRAM LIMITED
HEIGHTINCHES: 68 in
WEIGHTICAEL: 2433.88 [oz_av]

## 2017-08-08 LAB — ECHO INTRAOPERATIVE TEE
Height: 68 in
WEIGHTICAEL: 2433.88 [oz_av]

## 2017-08-08 LAB — ACID FAST SMEAR (AFB)

## 2017-08-08 LAB — APTT: APTT: 37 s — AB (ref 24–36)

## 2017-08-08 LAB — ABO/RH: ABO/RH(D): O NEG

## 2017-08-08 SURGERY — CREATION, PERICARDIAL WINDOW, SUBXIPHOID APPROACH
Anesthesia: General | Site: Chest

## 2017-08-08 MED ORDER — FENTANYL CITRATE (PF) 100 MCG/2ML IJ SOLN
INTRAMUSCULAR | Status: DC | PRN
  Administered 2017-08-08: 75 ug via INTRAVENOUS
  Administered 2017-08-08: 25 ug via INTRAVENOUS

## 2017-08-08 MED ORDER — DILTIAZEM HCL-DEXTROSE 100-5 MG/100ML-% IV SOLN (PREMIX)
5.0000 mg/h | INTRAVENOUS | Status: DC
  Administered 2017-08-08 (×2): 5 mg/h via INTRAVENOUS
  Filled 2017-08-08: qty 100

## 2017-08-08 MED ORDER — PROPOFOL 10 MG/ML IV BOLUS
INTRAVENOUS | Status: AC
  Filled 2017-08-08: qty 20

## 2017-08-08 MED ORDER — PROMETHAZINE HCL 25 MG/ML IJ SOLN: 6.2500 mg | INTRAMUSCULAR | Status: DC | PRN

## 2017-08-08 MED ORDER — ETOMIDATE 2 MG/ML IV SOLN
INTRAVENOUS | Status: DC | PRN
  Administered 2017-08-08: 20 mg via INTRAVENOUS

## 2017-08-08 MED ORDER — 0.9 % SODIUM CHLORIDE (POUR BTL) OPTIME
TOPICAL | Status: DC | PRN
  Administered 2017-08-08: 2000 mL

## 2017-08-08 MED ORDER — HYDROMORPHONE HCL 1 MG/ML IJ SOLN: 0.2500 mg | INTRAMUSCULAR | Status: DC | PRN

## 2017-08-08 MED ORDER — LIDOCAINE 2% (20 MG/ML) 5 ML SYRINGE
INTRAMUSCULAR | Status: AC
  Filled 2017-08-08: qty 5

## 2017-08-08 MED ORDER — MIDAZOLAM HCL 2 MG/2ML IJ SOLN
INTRAMUSCULAR | Status: AC
  Filled 2017-08-08: qty 2

## 2017-08-08 MED ORDER — TRAMADOL HCL 50 MG PO TABS
50.0000 mg | ORAL_TABLET | Freq: Four times a day (QID) | ORAL | Status: DC
  Administered 2017-08-08 – 2017-08-12 (×13): 50 mg via ORAL
  Filled 2017-08-08 (×13): qty 1

## 2017-08-08 MED ORDER — ONDANSETRON HCL 4 MG/2ML IJ SOLN
INTRAMUSCULAR | Status: AC
  Filled 2017-08-08: qty 2

## 2017-08-08 MED ORDER — CEFAZOLIN SODIUM-DEXTROSE 2-4 GM/100ML-% IV SOLN
INTRAVENOUS | Status: AC
  Filled 2017-08-08: qty 100

## 2017-08-08 MED ORDER — OXYCODONE HCL 5 MG PO TABS
5.0000 mg | ORAL_TABLET | ORAL | Status: DC | PRN
  Administered 2017-08-09 – 2017-08-10 (×4): 5 mg via ORAL
  Filled 2017-08-08 (×4): qty 1

## 2017-08-08 MED ORDER — ROCURONIUM BROMIDE 10 MG/ML (PF) SYRINGE
PREFILLED_SYRINGE | INTRAVENOUS | Status: AC
  Filled 2017-08-08: qty 10

## 2017-08-08 MED ORDER — DEXAMETHASONE SODIUM PHOSPHATE 10 MG/ML IJ SOLN
INTRAMUSCULAR | Status: AC
  Filled 2017-08-08: qty 1

## 2017-08-08 MED ORDER — SUGAMMADEX SODIUM 200 MG/2ML IV SOLN
INTRAVENOUS | Status: AC
  Filled 2017-08-08: qty 2

## 2017-08-08 MED ORDER — CEFAZOLIN SODIUM-DEXTROSE 2-4 GM/100ML-% IV SOLN
2.0000 g | INTRAVENOUS | Status: AC
  Administered 2017-08-08: 2 g via INTRAVENOUS

## 2017-08-08 MED ORDER — SUGAMMADEX SODIUM 200 MG/2ML IV SOLN
INTRAVENOUS | Status: DC | PRN
  Administered 2017-08-08: 200 mg via INTRAVENOUS

## 2017-08-08 MED ORDER — ROCURONIUM BROMIDE 10 MG/ML (PF) SYRINGE
PREFILLED_SYRINGE | INTRAVENOUS | Status: DC | PRN
  Administered 2017-08-08: 20 mg via INTRAVENOUS
  Administered 2017-08-08: 50 mg via INTRAVENOUS

## 2017-08-08 MED ORDER — EPHEDRINE SULFATE 50 MG/ML IJ SOLN
INTRAMUSCULAR | Status: AC
  Filled 2017-08-08: qty 1

## 2017-08-08 MED ORDER — ONDANSETRON HCL 4 MG/2ML IJ SOLN
INTRAMUSCULAR | Status: DC | PRN
  Administered 2017-08-08: 4 mg via INTRAVENOUS

## 2017-08-08 MED ORDER — LIDOCAINE 2% (20 MG/ML) 5 ML SYRINGE
INTRAMUSCULAR | Status: DC | PRN
  Administered 2017-08-08: 100 mg via INTRAVENOUS

## 2017-08-08 MED ORDER — AMIODARONE LOAD VIA INFUSION
150.0000 mg | Freq: Once | INTRAVENOUS | Status: AC
  Administered 2017-08-08: 150 mg via INTRAVENOUS
  Filled 2017-08-08: qty 83.34

## 2017-08-08 MED ORDER — ROCURONIUM BROMIDE 10 MG/ML (PF) SYRINGE
PREFILLED_SYRINGE | INTRAVENOUS | Status: AC
  Filled 2017-08-08: qty 20

## 2017-08-08 MED ORDER — ETOMIDATE 2 MG/ML IV SOLN
INTRAVENOUS | Status: AC
  Filled 2017-08-08: qty 10

## 2017-08-08 MED ORDER — LACTATED RINGERS IV SOLN: INTRAVENOUS | Status: DC

## 2017-08-08 MED ORDER — DEXAMETHASONE SODIUM PHOSPHATE 10 MG/ML IJ SOLN
INTRAMUSCULAR | Status: DC | PRN
  Administered 2017-08-08: 10 mg via INTRAVENOUS

## 2017-08-08 MED ORDER — AMIODARONE HCL IN DEXTROSE 360-4.14 MG/200ML-% IV SOLN
30.0000 mg/h | INTRAVENOUS | Status: DC
  Administered 2017-08-09 – 2017-08-11 (×6): 30 mg/h via INTRAVENOUS
  Filled 2017-08-08 (×7): qty 200

## 2017-08-08 MED ORDER — AMIODARONE HCL IN DEXTROSE 360-4.14 MG/200ML-% IV SOLN
60.0000 mg/h | INTRAVENOUS | Status: AC
  Administered 2017-08-08 (×2): 60 mg/h via INTRAVENOUS
  Filled 2017-08-08: qty 200

## 2017-08-08 MED ORDER — SODIUM CHLORIDE 0.9 % IJ SOLN
INTRAMUSCULAR | Status: AC
  Filled 2017-08-08: qty 20

## 2017-08-08 MED ORDER — FENTANYL CITRATE (PF) 250 MCG/5ML IJ SOLN
INTRAMUSCULAR | Status: AC
  Filled 2017-08-08: qty 5

## 2017-08-08 MED ORDER — LACTATED RINGERS IV SOLN
INTRAVENOUS | Status: DC | PRN
  Administered 2017-08-08: 17:00:00 via INTRAVENOUS

## 2017-08-08 SURGICAL SUPPLY — 40 items
CANISTER SUCT 3000ML PPV (MISCELLANEOUS) ×3 IMPLANT
CATH THORACIC 28FR (CATHETERS) IMPLANT
CATH THORACIC 28FR RT ANG (CATHETERS) IMPLANT
CATH THORACIC 36FR (CATHETERS) IMPLANT
CATH THORACIC 36FR RT ANG (CATHETERS) IMPLANT
CLIP VESOCCLUDE MED 6/CT (CLIP) ×3 IMPLANT
CLIP VESOCCLUDE SM WIDE 6/CT (CLIP) ×3 IMPLANT
CONT SPEC 4OZ CLIKSEAL STRL BL (MISCELLANEOUS) ×3 IMPLANT
DERMABOND ADVANCED (GAUZE/BANDAGES/DRESSINGS) ×1
DERMABOND ADVANCED .7 DNX12 (GAUZE/BANDAGES/DRESSINGS) ×2 IMPLANT
DRAIN CHANNEL 28F RND 3/8 FF (WOUND CARE) ×3 IMPLANT
DRAPE LAPAROSCOPIC ABDOMINAL (DRAPES) ×3 IMPLANT
ELECT BLADE 4.0 EZ CLEAN MEGAD (MISCELLANEOUS) ×3
ELECT REM PT RETURN 9FT ADLT (ELECTROSURGICAL) ×3
ELECTRODE BLDE 4.0 EZ CLN MEGD (MISCELLANEOUS) ×2 IMPLANT
ELECTRODE REM PT RTRN 9FT ADLT (ELECTROSURGICAL) ×2 IMPLANT
GAUZE SPONGE 4X4 12PLY STRL (GAUZE/BANDAGES/DRESSINGS) IMPLANT
GAUZE SPONGE 4X4 12PLY STRL LF (GAUZE/BANDAGES/DRESSINGS) ×3 IMPLANT
GLOVE ORTHO TXT STRL SZ7.5 (GLOVE) ×3 IMPLANT
GOWN STRL REUS W/ TWL XL LVL3 (GOWN DISPOSABLE) ×2 IMPLANT
GOWN STRL REUS W/TWL XL LVL3 (GOWN DISPOSABLE) ×1
KIT BASIN OR (CUSTOM PROCEDURE TRAY) ×3 IMPLANT
KIT TURNOVER KIT B (KITS) ×3 IMPLANT
NS IRRIG 1000ML POUR BTL (IV SOLUTION) ×3 IMPLANT
PACK CHEST (CUSTOM PROCEDURE TRAY) ×3 IMPLANT
PAD ARMBOARD 7.5X6 YLW CONV (MISCELLANEOUS) ×6 IMPLANT
PAD ELECT DEFIB RADIOL ZOLL (MISCELLANEOUS) ×3 IMPLANT
STRIP CLOSURE SKIN 1/2X4 (GAUZE/BANDAGES/DRESSINGS) ×3 IMPLANT
SUT MNCRL AB 3-0 PS2 18 (SUTURE) ×3 IMPLANT
SUT SILK  1 MH (SUTURE) ×1
SUT SILK 1 MH (SUTURE) ×2 IMPLANT
SUT VIC AB 1 CTX 18 (SUTURE) ×3 IMPLANT
SUT VIC AB 2-0 CT1 36 (SUTURE) ×3 IMPLANT
SUT VIC AB 3-0 SH 8-18 (SUTURE) ×6 IMPLANT
SYSTEM SAHARA CHEST DRAIN ATS (WOUND CARE) IMPLANT
TAPE CLOTH SURG 4X10 WHT LF (GAUZE/BANDAGES/DRESSINGS) ×3 IMPLANT
TOWEL GREEN STERILE FF (TOWEL DISPOSABLE) ×6 IMPLANT
TRAP SPECIMEN MUCOUS 40CC (MISCELLANEOUS) ×3 IMPLANT
TRAY FOLEY MTR SLVR 14FR STAT (SET/KITS/TRAYS/PACK) ×3 IMPLANT
WATER STERILE IRR 1000ML POUR (IV SOLUTION) ×6 IMPLANT

## 2017-08-08 NOTE — Progress Notes (Signed)
Amiodarone Drug - Drug Interaction Consult Note  Recommendations: Monitor QTc while on concurrent azithromycin. Monitor vitals and for any additional medication interactions.  Amiodarone is metabolized by the cytochrome P450 system and therefore has the potential to cause many drug interactions. Amiodarone has an average plasma half-life of 50 days (range 20 to 100 days).   There is potential for drug interactions to occur several weeks or months after stopping treatment and the onset of drug interactions may be slow after initiating amiodarone.   []  Statins: Increased risk of myopathy. Simvastatin- restrict dose to 20mg  daily. Other statins: counsel patients to report any muscle pain or weakness immediately.  []  Anticoagulants: Amiodarone can increase anticoagulant effect. Consider warfarin dose reduction. Patients should be monitored closely and the dose of anticoagulant altered accordingly, remembering that amiodarone levels take several weeks to stabilize.  []  Antiepileptics: Amiodarone can increase plasma concentration of phenytoin, the dose should be reduced. Note that small changes in phenytoin dose can result in large changes in levels. Monitor patient and counsel on signs of toxicity.  []  Beta blockers: increased risk of bradycardia, AV block and myocardial depression. Sotalol - avoid concomitant use.  [x]   Calcium channel blockers (diltiazem and verapamil): increased risk of bradycardia, AV block and myocardial depression.  []   Cyclosporine: Amiodarone increases levels of cyclosporine. Reduced dose of cyclosporine is recommended.  []  Digoxin dose should be halved when amiodarone is started.  []  Diuretics: increased risk of cardiotoxicity if hypokalemia occurs.  []  Oral hypoglycemic agents (glyburide, glipizide, glimepiride): increased risk of hypoglycemia. Patient's glucose levels should be monitored closely when initiating amiodarone therapy.   [x]  Drugs that prolong the QT  interval:  Torsades de pointes risk may be increased with concurrent use - avoid if possible.  Monitor QTc, also keep magnesium/potassium WNL if concurrent therapy can't be avoided. Marland Kitchen Antibiotics: e.g. fluoroquinolones, erythromycin. . Antiarrhythmics: e.g. quinidine, procainamide, disopyramide, sotalol. . Antipsychotics: e.g. phenothiazines, haloperidol.  . Lithium, tricyclic antidepressants, and methadone.  Thank You,  Doylene Canard, PharmD Clinical Pharmacist  Pager: [phone] Phone: [phone] 08/08/2017 7:44 PM

## 2017-08-08 NOTE — Anesthesia Procedure Notes (Signed)
Arterial Line Insertion Start/End7/22/2019 4:45 AM, 08/08/2017 4:50 AM Performed by: Barrington Ellison, CRNA, CRNA  Patient location: Pre-op. Preanesthetic checklist: patient identified Lidocaine 1% used for infiltration and patient sedated Left, radial was placed Catheter size: 20 G Hand hygiene performed  and maximum sterile barriers used  Allen's test indicative of satisfactory collateral circulation Attempts: 1 Procedure performed without using ultrasound guided technique. Following insertion, dressing applied and Biopatch. Post procedure assessment: normal  Patient tolerated the procedure well with no immediate complications.

## 2017-08-08 NOTE — Transfer of Care (Signed)
Immediate Anesthesia Transfer of Care Note  Patient: [name] [name]  Procedure(s) Performed: SUBXYPHOID PERICARDIAL WINDOW (N/A Chest) TRANSESOPHAGEAL ECHOCARDIOGRAM (TEE)  Patient Location: PACU  Anesthesia Type:General  Level of Consciousness: awake and oriented  Airway & Oxygen Therapy: Patient Spontanous Breathing and Patient connected to face mask oxygen  Post-op Assessment: Report given to RN  Post vital signs: Reviewed  Last Vitals:  Vitals Value Taken Time  BP 98/78 08/08/2017  6:21 PM  Temp    Pulse 137 08/08/2017  6:24 PM  Resp 17 08/08/2017  6:24 PM  SpO2 90 % 08/08/2017  6:24 PM  Vitals shown include unvalidated device data.  Last Pain:  Vitals:   08/08/17 1227  TempSrc: Oral  PainSc:          Complications: No apparent anesthesia complications

## 2017-08-08 NOTE — Anesthesia Procedure Notes (Signed)
Central Venous Catheter Insertion Performed by: Duane Boston, MD, anesthesiologist Start/End7/22/2019 4:38 PM, 08/08/2017 4:48 PM Patient location: Pre-op. Preanesthetic checklist: patient identified, IV checked, site marked, risks and benefits discussed, surgical consent, monitors and equipment checked, pre-op evaluation, timeout performed and anesthesia consent Position: Trendelenburg Lidocaine 1% used for infiltration and patient sedated Hand hygiene performed , maximum sterile barriers used  and Seldinger technique used Catheter size: 8 Fr Total catheter length 16. Central line was placed.Double lumen Procedure performed using ultrasound guided technique. Ultrasound Notes:anatomy identified, needle tip was noted to be adjacent to the nerve/plexus identified, no ultrasound evidence of intravascular and/or intraneural injection and image(s) printed for medical record Attempts: 1 Following insertion, dressing applied, line sutured and Biopatch. Post procedure assessment: blood return through all ports, free fluid flow and no air  Patient tolerated the procedure well with no immediate complications.

## 2017-08-08 NOTE — Op Note (Addendum)
CARDIOTHORACIC SURGERY OPERATIVE NOTE  Date of Procedure:   08/08/2017  Preoperative Diagnosis:  Pericardial Effusion with Pericardial Tamponade  Postoperative Diagnosis:  same  Procedure:    Butler Pericardial Window  Surgeon:    Valentina Gu. Roxy Manns, MD  Assistant:    John Giovanni, PA-C  Anesthesia:    Duane Boston, MD  Operative Findings:   Large, free-flowing, bloody pericardial effusion     BRIEF CLINICAL NOTE AND INDICATIONS FOR SURGERY  Patient is a 67 year old male with history of hypertension, anxiety, alcohol abuse, essential tremors, and remote history of prostate cancer who was admitted to the hospital with progressive shortness of breath, anorexia, generalized weakness, and disequilibrium in has now been referred for surgical consultation to discuss management options for pericardial effusion with pericardial tamponade.  Patient is a poor historian.  He describes vague gradual progression of anorexia, loss of strength, disequilibrium, and shortness of breath.  He was admitted to the hospital August 05, 2017.  On initial presentation he was hyponatremic with acute renal insufficiency.  Chest CT scan revealed bilateral pleural effusions left greater than right and a large pericardial effusion.  There were lung nodules suspicious for metastatic disease from unknown primary.  An echocardiogram was performed demonstrating large pericardial effusion with mixed findings suggestive of early tamponade.  Left thoracentesis was performed yielding serous fluid with cytology results pending.  Follow-up echocardiogram performed today demonstrates large pericardial effusion with more convincing evidence of pericardial tamponade.  Cardio thoracic surgical consultation was requested.  The patient has been seen in consultation and counseled at length regarding the indications, risks and potential benefits of surgery.  All questions have been answered, and the patient provides full informed  consent for the operation as described.     DETAILS OF THE OPERATIVE PROCEDURE  The patient is brought to the operating room on the above mentioned date and placed in the supine position on the operating table. A radial arterial line and central venous catheter placed by the anesthesia team. Intravenous antibiotics are administered. Pneumatic sequential compression boots are placed on both lower extremities. General endotracheal anesthesia is induced uneventfully. A Foley catheter is placed. The patient's anterior chest abdomen and both groins are prepared and draped in a sterile manner.  A time out procedure is performed. A small vertical incision is made in the midline beginning at the xiphoid process and extending towards the umbilicus. The incision is completed through the subcutaneous tissue and linea alba using electrocautery. The left body of rectus abdominis muscle is retracted in a cephalad direction and the subjacent pre-peritoneal fat and fascia retracted inferiorly until the anterior surface of the pericardium was exposed. A small incision is made in the pericardium. A large amount of bloody fluid is immediately evacuated. Portions of the fluid are trapped to be sent for routine laboratory data, culture, and cytology. A total of >500 mL of fluid is evacuated. Transesophageal echocardiogram was performed confirming evacuation of the majority of the pericardial effusion. The pericardial space is drained with a single 28 French Bard chest tube placed through a separate stab incision. During the procedure it was noted that there was communication into the peritoneal space and some serous ascites evacuated.  The abdominal fascia is closed in the midline using interrupted #1 Vicryl suture. Subcutaneous tissues are closed in layers and the skin is closed with a running subcuticular skin closure.  The patient tolerated the procedure well, was extubated in the operating room, and transported to the  postanesthesia care unit  in stable condition. Estimated blood loss was trivial. There are no intraoperative complications. All sponge instrument and needle counts are verified correct at completion the operation.  Patient should not be considered a candidate for systemic anticoagulation at this time due to risk of bleeding.     Valentina Gu. Roxy Manns MD 08/08/2017 6:02 PM

## 2017-08-08 NOTE — Consult Note (Signed)
[id number]       [name],[name] [address]             [phone]          CARDIOTHORACIC SURGERY CONSULTATION REPORT  PCP is [name], MD Referring Provider is Sueanne Margarita, MD Primary Cardiologist is Christena Flake, MD  Reason for consultation:  Pericardial effusion with pericardial tamponade  HPI:  Patient is a 67 year old male with history of hypertension, anxiety, alcohol abuse, essential tremors, and remote history of prostate cancer who was admitted to the hospital with progressive shortness of breath, anorexia, generalized weakness, and disequilibrium in has now been referred for surgical consultation to discuss management options for pericardial effusion with pericardial tamponade.  Patient is a poor historian.  He describes vague gradual progression of anorexia, loss of strength, disequilibrium, and shortness of breath.  He was admitted to the hospital August 05, 2017.  On initial presentation he was hyponatremic with acute renal insufficiency.  Chest CT scan revealed bilateral pleural effusions left greater than right and a large pericardial effusion.  There were lung nodules suspicious for metastatic disease from unknown primary.  An echocardiogram was performed demonstrating large pericardial effusion with mixed findings suggestive of early tamponade.  Left thoracentesis was performed yielding serous fluid with cytology results pending.  Follow-up echocardiogram performed today demonstrates large pericardial effusion with more convincing evidence of pericardial tamponade.  Cardio thoracic surgical consultation was requested.  Patient is divorced and lives alone.  He has been disabled and out of work for many years because of essential tremor.  He has a reported history of alcohol abuse in the past.  He has 2 adult children who reportedly are supportive although none are currently at the bedside at the time of consultation.  Patient denies any history of  chest pain or chest tightness either with activity or rest.  He denies any pleuritic chest pain.  He reports only shortness of breath and generalized weakness.  Denies any problems with headaches or visual disturbances.  Past Medical History:  Diagnosis Date  . Cancer [facility])    Prostate cancer  . Hypertension   . Pericardial effusion 08/05/2017    History reviewed. No pertinent surgical history.  History reviewed. No pertinent family history.  Social History   Socioeconomic History  . Marital status: Married    Spouse name: Not on file  . Number of children: Not on file  . Years of education: Not on file  . Highest education level: Not on file  Occupational History  . Not on file  Social Needs  . Financial resource strain: Not on file  . Food insecurity:    Worry: Not on file    Inability: Not on file  . Transportation needs:    Medical: Not on file    Non-medical: Not on file  Tobacco Use  . Smoking status: Current Every Day Smoker    Packs/day: 1.00  . Smokeless tobacco: Never Used  Substance and Sexual Activity  . Alcohol use: Not Currently  . Drug use: Never  . Sexual activity: Not on file  Lifestyle  . Physical activity:    Days per week: Not on file    Minutes per session: Not on file  . Stress: Not on file  Relationships  . Social connections:    Talks on phone: Not on file    Gets together: Not on file    Attends religious service: Not on file  Active member of club or organization: Not on file    Attends meetings of clubs or organizations: Not on file    Relationship status: Not on file  . Intimate partner violence:    Fear of current or ex partner: Not on file    Emotionally abused: Not on file    Physically abused: Not on file    Forced sexual activity: Not on file  Other Topics Concern  . Not on file  Social History Narrative  . Not on file    Prior to Admission medications   Medication Sig Start Date End Date Taking? Authorizing Provider    amLODipine (NORVASC) 5 MG tablet Take 5 mg by mouth daily. 07/20/17  Yes [provider]  clonazePAM (KLONOPIN) 1 MG tablet Take 1 mg by mouth 3 (three) times daily. 05/09/17  Yes [provider]  cloNIDine (CATAPRES) 0.1 MG tablet Take 0.1 mg by mouth 2 (two) times daily. 07/27/17  Yes [provider]  gabapentin (NEURONTIN) 300 MG capsule Take 300 mg by mouth 2 (two) times daily. 06/20/17  Yes [provider]  oxybutynin (DITROPAN) 5 MG tablet Take 5 mg by mouth 2 (two) times daily.  01/21/17  Yes [provider]  sertraline (ZOLOFT) 100 MG tablet Take 100 mg by mouth daily. 06/15/17  Yes [provider]  tamsulosin (FLOMAX) 0.4 MG CAPS capsule Take 0.4 mg by mouth daily.  07/27/16  Yes [provider]  topiramate (TOPAMAX) 100 MG tablet Take 100 mg by mouth 2 (two) times daily. 06/27/17  Yes [provider]    Current Facility-Administered Medications  Medication Dose Route Frequency Provider Last Rate Last Dose  . 0.9 %  sodium chloride infusion   Intravenous Continuous Dhungel, Nishant, MD 10 mL/hr at 08/06/17 1051    . acetaminophen (TYLENOL) tablet 650 mg  650 mg Oral Q6H PRN Manuella Ghazi, Pratik D, DO       Or  . acetaminophen (TYLENOL) suppository 650 mg  650 mg Rectal Q6H PRN Manuella Ghazi, Pratik D, DO      . azithromycin (ZITHROMAX) 250 mg in sodium chloride 0.9 % 100 mL injection   Intravenous Q24H Dhungel, Nishant, MD 0 mL/hr at 08/07/17 0700    . bisacodyl (DULCOLAX) EC tablet 5 mg  5 mg Oral Daily PRN Lovey Newcomer T, NP   5 mg at 08/07/17 0153  . bisacodyl (DULCOLAX) suppository 10 mg  10 mg Rectal Once Manuella Ghazi, Pratik D, DO      . cefTRIAXone (ROCEPHIN) 1 g in sodium chloride 0.9 % 100 mL IVPB  1 g Intravenous Q24H Manuella Ghazi, Pratik D, DO   Stopped at 08/08/17 0131  . chlorhexidine (PERIDEX) 0.12 % solution 15 mL  15 mL Mouth Rinse BID Manuella Ghazi, Pratik D, DO   15 mL at 08/08/17 1103  . dextromethorphan-guaiFENesin (MUCINEX DM) 30-600 MG per 12  hr tablet 1 tablet  1 tablet Oral BID Lovey Newcomer T, NP   1 tablet at 08/07/17 2253  . feeding supplement (BOOST / RESOURCE BREEZE) liquid 1 Container  1 Container Oral TID BM Manuella Ghazi, Pratik D, DO   1 Container at 08/07/17 1428  . fluticasone (FLONASE) 50 MCG/ACT nasal spray 1 spray  1 spray Each Nare Daily Shah, Pratik D, DO   1 spray at 08/08/17 1106  . folic acid (FOLVITE) tablet 1 mg  1 mg Oral Daily Dhungel, Nishant, MD   1 mg at 08/07/17 1018  . guaiFENesin-dextromethorphan (ROBITUSSIN DM) 100-10 MG/5ML syrup 5 mL  5 mL Oral Q4H PRN Manuella Ghazi, Pratik D, DO   5 mL at 08/06/17 1816  . ipratropium-albuterol (DUONEB) 0.5-2.5 (3) MG/3ML nebulizer solution 3 mL  3 mL Nebulization Q6H PRN Manuella Ghazi, Pratik D, DO      . lactated ringers infusion   Intravenous Continuous Donne Hazel, MD 100 mL/hr at 08/08/17 1114    . MEDLINE mouth rinse  15 mL Mouth Rinse q12n4p Manuella Ghazi, Pratik D, DO   15 mL at 08/08/17 1106  . multivitamin with minerals tablet 1 tablet  1 tablet Oral Daily Dhungel, Nishant, MD   1 tablet at 08/07/17 1019  . nicotine (NICODERM CQ - dosed in mg/24 hours) patch 21 mg  21 mg Transdermal Daily Dhungel, Nishant, MD   21 mg at 08/08/17 1104  . ondansetron (ZOFRAN) tablet 4 mg  4 mg Oral Q6H PRN Manuella Ghazi, Pratik D, DO       Or  . ondansetron (ZOFRAN) injection 4 mg  4 mg Intravenous Q6H PRN Manuella Ghazi, Pratik D, DO      . oxybutynin (DITROPAN) tablet 5 mg  5 mg Oral BID Manuella Ghazi, Pratik D, DO   5 mg at 08/08/17 1103  . sertraline (ZOLOFT) tablet 100 mg  100 mg Oral Daily Manuella Ghazi, Pratik D, DO   100 mg at 08/08/17 1103  . tamsulosin (FLOMAX) capsule 0.4 mg  0.4 mg Oral Daily Manuella Ghazi, Pratik D, DO   0.4 mg at 08/08/17 1103  . thiamine (VITAMIN B-1) tablet 100 mg  100 mg Oral Daily Dhungel, Nishant, MD   100 mg at 08/07/17 1019  . topiramate (TOPAMAX) tablet 100 mg  100 mg Oral BID Manuella Ghazi, Pratik D, DO   100 mg at 08/08/17 1103    Allergies  Allergen Reactions  . Bee Venom Anaphylaxis      Review of Systems:  Per  HPI.  Remainder non-contributory    Physical Exam:   BP 99/72 (BP Location: Left Arm)   Pulse 72   Temp 97.8 F (36.6 C) (Oral)   Resp 17   Ht 5\' 8"  (1.727 m)   Wt 152 lb 1.9 oz (69 kg)   SpO2 98%   BMI 23.13 kg/m   General:  Thin, malnourished, weak in NAD  w  HEENT:  Unremarkable   Neck:   +JVD, no bruits, no adenopathy   Chest:   clear to auscultation, symmetrical breath sounds, no wheezes, no rhonchi   CV:   Distant heart soundsRRR, no  murmur   Abdomen:  soft, non-tender, no masses   Extremities:  warm, well-perfused, pulses thready but palpable,  lower extremity edema  Rectal/GU  Deferred  Neuro:   Grossly non-focal and symmetrical throughout  Skin:   Clean and dry, no rashes, no breakdown  Diagnostic Tests:  Lab Results: Recent Labs    08/07/17 0342  WBC 12.1*  HGB 11.2*  HCT 34.8*  PLT 168   BMET:  Recent Labs    08/07/17 0342 08/08/17 0501  NA 127* 128*  K 3.8 3.8  CL 93* 95*  CO2 17* 17*  GLUCOSE 92 90  BUN 71* 74*  CREATININE 2.06* 2.06*  CALCIUM 8.5* 8.7*    CBG (last 3)  No results for input(s): GLUCAP in the last 72 hours. PT/INR:  No results for input(s): LABPROT, INR in the last 72 hours.  CXR:  ABDOMEN - 2 VIEW; CHEST - 2 VIEW  COMPARISON:  None.  FINDINGS: There are small bilateral pleural effusions with bibasilar atelectatic changes. Infiltrate  is not excluded. Clinical correlation is recommended. There is an 11 mm pulmonary nodule seen on the lateral view. A 15 mm nodular density in the inferior right lung field may correspond to nipple shadow or represent a lung nodule. There is no pneumothorax. Mild cardiomegaly.  There is moderate stool within the colon. Mildly dilated loops of small bowel noted in the upper and mid abdomen which may represent an ileus versus obstruction. There is air within the colon. No free air noted. Small calcific density over the right renal silhouette may represent renal calculi. Prostate  brachytherapy seeds noted. There is degenerative changes of the spine. No acute osseous pathology.  IMPRESSION: 1. Small bilateral pleural effusions and bibasilar atelectasis versus infiltrate. 2. Pulmonary nodules. Further evaluation with chest CT on a nonemergent basis recommended. 3. Mildly dilated small bowel loops may represent an ileus versus obstruction. Clinical correlation is recommended.   Electronically Signed   By: Anner Crete M.D.   On: 08/05/2017    CT CHEST WITHOUT CONTRAST  TECHNIQUE: Multidetector CT imaging of the chest was performed following the standard protocol without IV contrast.  COMPARISON:  Chest radiograph dated 08/04/2017  FINDINGS: Evaluation of this exam is limited in the absence of intravenous contrast.  Cardiovascular: There is no cardiomegaly. There is a large pericardial effusion measuring approximately 3 cm in thickness. Eighth cardia may provide better evaluation of the pericardial effusion and assessment for cardiac ejection fraction. There is mild atherosclerotic calcification of the thoracic aorta. The central pulmonary arteries are grossly unremarkable on this noncontrast CT.  Mediastinum/Nodes: There are multiple enlarged mediastinal lymph nodes measuring up to 2.3 cm in the right paratracheal region and 3 cm in the subcarinal node. Esophagus is grossly unremarkable. Small and heterogeneous thyroid gland with possible tiny hypodense nodules and small calcific foci. Ultrasound may provide better evaluation of the thyroid gland if clinically indicated.  Lungs/Pleura: There are small bilateral pleural effusions with associated partial compressive atelectasis of the lower lobes. Pneumonia is not excluded. Multiple bilateral pulmonary nodules measure up to 14 mm at the right lung base and 10 mm in the left upper lobe concerning for metastatic disease given findings of adenopathy and pleural effusion. There is no  pneumothorax. There is centrilobular and paraseptal emphysema. The central airways are patent.  Upper Abdomen: There is a small ascites. Two hypodense lesions in the right lobe of the liver measure up to 21 x 18 mm suspicious for metastatic disease. Partially visualized gallstones. Retroperitoneal adenopathy measure up to 2.4 cm to the left of the aorta. There are areas of nodularity in the upper mesentery and omentum concerning for metastatic implant. Areas of nodularity along the diaphragms bilaterally also concerning for peritoneal implants.  Musculoskeletal: No chest wall mass or suspicious bone lesions identified.  IMPRESSION: 1. Bilateral pulmonary nodules, mediastinal and retroperitoneal adenopathy, hepatic lesions, omental and peritoneal implants, and bilateral pleural effusions and ascites. The constellation of findings most consistent with metastatic disease of indeterminate primary. Further evaluation with CT of the abdomen and pelvis with oral and IV contrast on a nonemergent basis recommended. Sampling of the pleural effusion or ascites may provide diagnostic cytology. 2. Large pericardial effusion measuring 3 cm in thickness. Echocardiogram may provide better evaluation of the pericardial effusion and cardiac function.  These results were called by telephone at the time of interpretation on 08/05/2017 at 3:45 am to nurse Marcello Moores , who verbally acknowledged these results.   Electronically Signed   By: Laren Everts.D.  On: 08/05/2017 03:49   Transthoracic Echocardiography  Patient:    [name] [name], [name] [name] MR #:       [id number] Study Date: 08/05/2017 Gender:     M Age:        59 Height:     172.7 cm Weight:     69 kg BSA:        1.82 m^2 Pt. Status: Room:       Leighton, Iva L  SONOGRAPHER  [facility]  PERFORMING   [facility], [facility]  ADMITTING    [place], [place]     Shah, Pratik D  REFERRING    Manuella Ghazi, Pratik  D  cc:  ------------------------------------------------------------------- LV EF: 50% -   55%  ------------------------------------------------------------------- Indications:      Pericardial effusion 423.9.  ------------------------------------------------------------------- History:   PMH:  Prostate cancer.  Risk factors:  Current tobacco use. Hypertension.  ------------------------------------------------------------------- Study Conclusions  - Left ventricle: The cavity size was normal. Wall thickness was   increased in a pattern of moderate LVH. Systolic function was   normal. The estimated ejection fraction was in the range of 50%   to 55%. Wall motion was normal; there were no regional wall   motion abnormalities. Doppler parameters are consistent with   abnormal left ventricular relaxation (grade 1 diastolic   dysfunction). Doppler parameters are consistent with high   ventricular filling pressure. - Aortic valve: Mildly calcified annulus. Trileaflet; mildly   thickened leaflets. Valve area (VTI): 2.89 cm^2. Valve area   (Vmax): 2.24 cm^2. - Mitral valve: Mildly calcified annulus. Mildly thickened leaflets   . - Inferior vena cava: The vessel was normal in size. The   respirophasic diameter changes were in the normal range (= 50%),   consistent with normal central venous pressure. - Pericardium, extracardiac: There is a large circumferential   pericardial effusion, most prominent adjacent to the RV measuring   2.7 cm in diastole. Respiratory variation across the MV is   approximately 34%. Variation across TV is not interpretable.   There is buckling of the RA without frank collapse. No clear RV   collapse. The IVC appears normal measuring 1.9 cm, with normal   variation. (Not seen on 2D but noted on M Mode) Overall mixed   criteria for tamponade physiology. - Technically adequate study. - Recommend repeat echo in 24 to 48 hours to reassess  effusion.  ------------------------------------------------------------------- Study data:  No prior study was available for comparison.  Study status:  Routine.  Procedure:  Transthoracic echocardiography. Image quality was adequate.          Transthoracic echocardiography.  M-mode, complete 2D, spectral Doppler, and color Doppler.  Birthdate:  Patient birthdate: 24-May-1950.  Age:  Patient is 67 yr old.  Sex:  Gender: male.    BMI: 23.1 kg/m^2.  Blood pressure:     114/87  Patient status:  Inpatient.  Study date: Study date: 08/05/2017. Study time: 11:05 AM.  Location:  Bedside.   -------------------------------------------------------------------  ------------------------------------------------------------------- Left ventricle:  The cavity size was normal. Wall thickness was increased in a pattern of moderate LVH. Systolic function was normal. The estimated ejection fraction was in the range of 50% to 55%. Wall motion was normal; there were no regional wall motion abnormalities. Doppler parameters are consistent with abnormal left ventricular relaxation (grade 1 diastolic dysfunction). Doppler parameters are consistent with high ventricular filling pressure.   ------------------------------------------------------------------- Aortic valve:   Mildly calcified annulus. Trileaflet; mildly  thickened leaflets.  Doppler:   There was no stenosis.   There was no significant regurgitation.    VTI ratio of LVOT to aortic valve: 1.02. Valve area (VTI): 2.89 cm^2. Indexed valve area (VTI): 1.59 cm^2/m^2. Peak velocity ratio of LVOT to aortic valve: 0.79. Valve area (Vmax): 2.24 cm^2. Indexed valve area (Vmax): 1.23 cm^2/m^2.  Mean gradient (S): 2 mm Hg. Peak gradient (S): 3 mm Hg.  ------------------------------------------------------------------- Aorta:  Aortic root: The aortic root was normal in size.  ------------------------------------------------------------------- Mitral  valve:   Mildly calcified annulus. Mildly thickened leaflets .  Doppler:   There was no evidence for stenosis.   There was no significant regurgitation.  ------------------------------------------------------------------- Left atrium:  The atrium was normal in size.  ------------------------------------------------------------------- Atrial septum:  Poorly visualized.  ------------------------------------------------------------------- Right ventricle:  Poorly visualized. The cavity size was normal.   ------------------------------------------------------------------- Pulmonic valve:   Not well visualized.  Doppler:   There was no evidence for stenosis.   There was no significant regurgitation.   ------------------------------------------------------------------- Tricuspid valve:   Normal thickness leaflets.  Doppler:   There was no evidence for stenosis.   There was trivial regurgitation. Inadequate TR jet.  ------------------------------------------------------------------- Right atrium:  The atrium was normal in size.  ------------------------------------------------------------------- Pericardium:  There is a large circumferential pericardial effusion, most prominent adjacent to the RV measuring 2.7 cm in diastole. Respiratory variation across the MV is approximately 34%. Variation across TV is not interpretable. There is buckling of the RA without frank collapse. No clear RV collapse. The IVC appears normal measuring 1.9 cm, with normal variation. (Not seen on 2D but noted on M Mode) Overall mixed criteria for tamponade physiology.   ------------------------------------------------------------------- Systemic veins: Inferior vena cava: The vessel was normal in size. The respirophasic diameter changes were in the normal range (= 50%), consistent with normal central venous  pressure.  ------------------------------------------------------------------- Measurements   Left ventricle                            Value          Reference  LV ID, ED, PLAX chordal           (L)     32.6  mm       43 - 52  LV ID, ES, PLAX chordal                   25    mm       23 - 38  LV fx shortening, PLAX chordal    (L)     23    %        >=29  LV PW thickness, ED                       13.9  mm       ---------  IVS/LV PW ratio, ED                       0.91           <=1.3    Ventricular septum                        Value          Reference  IVS thickness, ED  12.6  mm       ---------    LVOT                                      Value          Reference  LVOT ID, S                                19    mm       ---------  LVOT area                                 2.84  cm^2     ---------  LVOT peak velocity, S                     71.3  cm/s     ---------  LVOT VTI, S                               12.41 cm       ---------    Aortic valve                              Value          Reference  Aortic valve peak velocity, S             90.17 cm/s     ---------  Aortic valve mean velocity, S             56.65 cm/s     ---------  Aortic mean gradient, S                   2     mm Hg    ---------  Aortic peak gradient, S                   3     mm Hg    ---------  VTI ratio, LVOT/AV                        1.02           ---------  Aortic valve area, VTI                    2.89  cm^2     ---------  Aortic valve area/bsa, VTI                1.59  cm^2/m^2 ---------  Velocity ratio, peak, LVOT/AV             0.79           ---------  Aortic valve area, peak velocity          2.24  cm^2     ---------  Aortic valve area/bsa, peak               1.23  cm^2/m^2 ---------  velocity    Aorta  Value          Reference  Aortic root ID, ED                        33    mm       ---------    Left atrium                                Value          Reference  LA ID, A-P, ES                            25    mm       ---------  LA ID/bsa, A-P                            1.37  cm/m^2   <=2.2  LA volume/bsa, S                          18.4  ml/m^2   ---------    Mitral valve                              Value          Reference  Mitral E-wave peak velocity               50.4  cm/s     ---------  Mitral A-wave peak velocity               55.8  cm/s     ---------  Mitral deceleration time                  190   ms       150 - 230  Mitral E/A ratio, peak                    0.9            ---------    Right ventricle                           Value          Reference  TAPSE                                     22    mm       ---------  RV s&', lateral, S                         9.36  cm/s     ---------  Legend: (L)  and  (H)  mark values outside specified reference range.  ------------------------------------------------------------------- Prepared and Electronically Authenticated by  Kerry Hough, M.D. 2019-07-19T12:59:47    Transthoracic Echocardiography  Patient:    [name] [name], [name] [name] MR #:       [id number] Study Date: 08/08/2017 Gender:     M Age:        13 Height:     172.7 cm Weight:     69 kg BSA:  1.82 m^2 Pt. Status: Room:       5W06C   ATTENDING    Neysa Bonito, Inpatient  [address]     Almyra Deforest [id number]  [place] Guardian [id number]  SONOGRAPHER  Roseanna Rainbow  ADMITTING    Manuella Ghazi, Pratik D  cc:  ------------------------------------------------------------------- LV EF: 55% -   60%  ------------------------------------------------------------------- Indications:      Pericardial effusion 423.9.  ------------------------------------------------------------------- History:   PMH:  Pleural effusion. Pneumonia. Prostate cancer. Risk factors:  Current tobacco use. Hypertension.  ------------------------------------------------------------------- Study  Conclusions  - Left ventricle: The cavity size was normal. Systolic function was   normal. The estimated ejection fraction was in the range of 55%   to 60%. Wall motion was normal; there were no regional wall   motion abnormalities. - Inferior vena cava: The vessel was dilated. The respirophasic   diameter changes were blunted (< 50%), consistent with elevated   central venous pressure. - Pericardium, extracardiac: A large, free-flowing pericardial   effusion was identified circumferential to the heart. The fluid   had no internal echoes. Multiple small mobile echodensities were   attached to the epicardial surface. There was mildright atrial   chamber collapse for less than 50% of the cardiac cycle.   Respirophasic change in stroke volume was excessive. Features   were consistent with tamponade physiology.  Urgent and Critical Findings:   A critical finding, cardiac tamponade, was reported to Dr. Wyline Copas , an attending physician responsible for the patient , by Dr.Croitoru , on 08/08/2017 , at 11:51 AM. Impressions:  - compared to the previous study on 08/05/2017, there are more   convincing findings for pericardial tamponade.  ------------------------------------------------------------------- Study data:  Comparison was made to the study of 08/05/2017.  Study status:  Emergent.  Procedure:  The patient reported no pain pre or post test. Transthoracic echocardiography. Image quality was adequate.  Study completion:  There were no complications. Transthoracic echocardiography.  M-mode, limited 2D, limited spectral Doppler, and color Doppler.  Birthdate:  Patient birthdate: 1950-07-16.  Age:  Patient is 67 yr old.  Sex:  Gender: male.    BMI: 23.1 kg/m^2.  Blood pressure:     95/70  Patient status:  Inpatient.  Study date:  Study date: 08/08/2017. Study time: 10:28 AM.  Location:   Bedside.  -------------------------------------------------------------------  ------------------------------------------------------------------- Left ventricle:  The cavity size was normal. Systolic function was normal. The estimated ejection fraction was in the range of 55% to 60%. Wall motion was normal; there were no regional wall motion abnormalities.  ------------------------------------------------------------------- Aortic valve:   Trileaflet; normal thickness leaflets. Mobility was not restricted.  Doppler:  Transvalvular velocity was within the normal range. There was no stenosis. There was no regurgitation.   ------------------------------------------------------------------- Aorta:  Aortic root: The aortic root was normal in size.  ------------------------------------------------------------------- Mitral valve:   Structurally normal valve.   Mobility was not restricted.  Doppler:  Transvalvular velocity was within the normal range. There was no evidence for stenosis. There was trivial regurgitation.  ------------------------------------------------------------------- Left atrium:  The atrium was normal in size.  ------------------------------------------------------------------- Right ventricle:  The cavity size was normal. Wall thickness was normal. Systolic function was normal.  ------------------------------------------------------------------- Pulmonic valve:   Poorly visualized.  The valve appears to be grossly normal.    Doppler:  There was no significant regurgitation.  ------------------------------------------------------------------- Tricuspid valve:   Structurally normal valve.    Doppler: Transvalvular velocity was within the normal range.  There was no regurgitation.  ------------------------------------------------------------------- Pulmonary artery:   The main pulmonary artery was normal-sized. Systolic pressure was within the normal  range.  ------------------------------------------------------------------- Right atrium:  The atrium was normal in size.  ------------------------------------------------------------------- Pericardium:  A large, free-flowing pericardial effusion was identified circumferential to the heart. The fluid had no internal echoes.  Doppler:  There was mildright atrial chamber collapse for less than 50% of the cardiac cycle. There is no right ventricular collapse. Respirophasic change in stroke volume was excessive. Features were consistent with tamponade physiology.  ------------------------------------------------------------------- Systemic veins: Inferior vena cava: The vessel was dilated. The respirophasic diameter changes were blunted (< 50%), consistent with elevated central venous pressure. Diameter: 23.14 mm.  ------------------------------------------------------------------- Measurements   IVC                                      Value        Reference  ID                                       23.14 mm     ---------    Left ventricle                           Value        Reference  LV ID, ED, PLAX chordal        (L)       31.7  mm     43 - 52  LV ID, ES, PLAX chordal                  25.4  mm     23 - 38  LV fx shortening, PLAX chordal (L)       20    %      >=29  LV PW thickness, ED                      13.1  mm     ---------  IVS/LV PW ratio, ED                      1.12         <=1.3    Ventricular septum                       Value        Reference  IVS thickness, ED                        14.7  mm     ---------    Aorta                                    Value        Reference  Aortic root ID, ED                       37    mm     ---------    Left atrium  Value        Reference  LA ID, A-P, ES                           15    mm     ---------  LA ID/bsa, A-P                           0.82  cm/m^2 <=2.2  Legend: (L)  and  (H)  mark  values outside specified reference range.  ------------------------------------------------------------------- Prepared and Electronically Authenticated by  Sanda Klein, MD 2019-07-22T11:59:46    Impression:  I have personally examined the patient and reviewed the patient's CT scan and echocardiograms.  He has a large free-flowing pericardial effusion with both hemodynamic and echocardiographic findings consistent with early pericardial tamponade.  Chest CT scan is also notable for the presence of bilateral pulmonary nodules worrisome for the possibility of metastatic disease.  Cytology from recent left needle thoracentesis remains pending.  I agree the patient's pericardial effusion needs to be drained.  Options include pericardiocentesis in the Cath Lab versus surgical drainage.  Plan:  I have discussed the indications, risk, and potential benefits of subxiphoid pericardial window with the patient at bedside.  Alternative treatment strategies have been discussed.  The patient understands and accepts all potential associated risks of surgery including but not limited to risk of death, stroke, myocardial infarction, congestive heart failure, respiratory failure, renal failure, bleeding requiring blood transfusion and/or reexploration, arrhythmia, pneumonia, pleural effusion, wound infection, pulmonary embolus or other thromboembolic complication, chronic pain or other delayed complications including recurrent pericardial effusion.  All questions answered.  We plan to proceed to the operative room this afternoon for subxiphoid pericardial window    I spent in excess of 60 minutes during the conduct of this hospital consultation and >50% of this time involved direct face-to-face encounter for counseling and/or coordination of the patient's care.   Valentina Gu. Roxy Manns, MD 08/08/2017 1:49 PM

## 2017-08-08 NOTE — Progress Notes (Signed)
  Echocardiogram 2D Echocardiogram has been performed.  Bobbye Charleston 08/08/2017, 10:56 AM

## 2017-08-08 NOTE — Plan of Care (Signed)
  Problem: Nutrition: Goal: Adequate nutrition will be maintained Outcome: Progressing   Problem: Coping: Goal: Level of anxiety will decrease Outcome: Progressing   Problem: Elimination: Goal: Will not experience complications related to urinary retention Note:  Decreased output

## 2017-08-08 NOTE — Anesthesia Procedure Notes (Addendum)
Procedure Name: Intubation Date/Time: 08/08/2017 5:12 PM Performed by: Barrington Ellison, CRNA Pre-anesthesia Checklist: Patient identified, Emergency Drugs available, Suction available and Patient being monitored Patient Re-evaluated:Patient Re-evaluated prior to induction Oxygen Delivery Method: Circle System Utilized Preoxygenation: Pre-oxygenation with 100% oxygen Induction Type: IV induction Ventilation: Mask ventilation without difficulty Laryngoscope Size: Mac and 3 Grade View: Grade I Tube type: Oral Tube size: 7.5 mm Number of attempts: 1 Airway Equipment and Method: Stylet and Oral airway Placement Confirmation: ETT inserted through vocal cords under direct vision,  positive ETCO2 and breath sounds checked- equal and bilateral Secured at: 22 cm Tube secured with: Tape Dental Injury: Teeth and Oropharynx as per pre-operative assessment

## 2017-08-08 NOTE — Care Management Important Message (Signed)
Important Message  Patient Details  Name: [name] [name] MRN: [id number] Date of Birth: [birth year]   Medicare Important Message Given:  Yes    [name] 08/08/2017, 4:15 PM

## 2017-08-08 NOTE — Brief Op Note (Addendum)
08/04/2017 - 08/08/2017  5:53 PM  PATIENT:  [name] [name]  67 y.o. male  PRE-OPERATIVE DIAGNOSIS:  pericardial window  POST-OPERATIVE DIAGNOSIS:  pericardial window  PROCEDURE:  Procedure(s): SUBXYPHOID PERICARDIAL WINDOW (N/A) TRANSESOPHAGEAL ECHOCARDIOGRAM (TEE)  SURGEON:  Surgeon(s) and Role:    Rexene Alberts, MD - Primary  PHYSICIAN ASSISTANT: WAYNE GOLD PA-C  ANESTHESIA:   regional  EBL:  40 mL   BLOOD ADMINISTERED:none  DRAINS:28 F BARD  LOCAL MEDICATIONS USED:  NONE  SPECIMEN:  Source of Specimen:  PERICARDIUM AND PERICARDIAL FLUID  DISPOSITION OF SPECIMEN:  PATHOLOGY  COUNTS:  YES  TOURNIQUET:  * No tourniquets in log *  DICTATION: .Dragon Dictation  PLAN OF CARE: Admit to inpatient   PATIENT DISPOSITION:  PACU - hemodynamically stable.   Delay start of Pharmacological VTE agent (>24hrs) due to surgical blood loss or risk of bleeding: yes  COMPLICATIONS: NO KNOWN  FINDINGS: BLOODY PERICARDIAL EFFUSION

## 2017-08-08 NOTE — Progress Notes (Signed)
  Echocardiogram Echocardiogram Transesophageal has been performed.  [name] [name] 08/08/2017, 5:58 PM

## 2017-08-08 NOTE — Anesthesia Procedure Notes (Signed)
Performed by: Barrington Ellison, CRNA

## 2017-08-08 NOTE — Progress Notes (Signed)
[facility] BRIEF POST-OP PROGRESS NOTE  Day of Surgery  S/P Procedure(s) (LRB): SUBXYPHOID PERICARDIAL WINDOW (N/A) TRANSESOPHAGEAL ECHOCARDIOGRAM (TEE)   Patient is awake and alert in PACU Denies pain or SOB, but reports feeling "terrible" Went into rapid Afib w/ HR 120-140, stable BP Chest tube output thin serosanguinous, less bloody  Plan: Will start IV amiodarone w/ bolus.  Patient is not a candidate for systemic anticoagulation at this time.  Rexene Alberts, MD 08/08/2017 7:06 PM

## 2017-08-08 NOTE — Progress Notes (Signed)
Received call from Dr. Gwenlyn Found who was called by Dr. Sallyanne Kuster regarding echo today which shows increased pericardial effusion with findings c/w tamponade.  I have contacted CV[facility] and placed stat consult for Pericardial Window.

## 2017-08-08 NOTE — Progress Notes (Signed)
PROGRESS NOTE    [name] [name]  EHM:[id number] DOB: 1950/11/22 DOA: 08/04/2017 PCP: [name], MD    Brief Narrative:  67 year old male with history of prostate cancer (reports getting radiation therapy), history of alcohol abuse (reports not having a drink for past few weeks, used to drink at least 6 packs of beer daily), heavy smoker, hypertension, anxiety/depression and essential tremors who presented to the ED with sinus congestion, weakness and unsteady gait for past few weeks which has gotten worse in the past few days.  He also reports very poor appetite and per his ex-wife at bedside he has lost probably about 100 pounds since he last saw him 3-4 months ago.  Patient is a poor historian.  He complained that he was having some sinus congestion and cough being unable to produce any phlegm.  He also reported some shortness of breath.  He denied any fevers, chills, chest pain, orthopnea, PND, nausea, vomiting, abdominal pain, dysuria, diarrhea, tingling or numbness of his extremities.  In the ED his vitals were stable but labs showed WBC of 13 point 8K hemoglobin of 12.6, platelets of 236, sodium of 125, potassium of 3.1, AKI with BUN of 54 and creatinine of 2.16 calcium of 8.7, AST of 42, alkaline phosphatase of 262, bilirubin of 1.6 and lactic acid of 4.2 which subsequently improved to 3.2.  Her chest x-ray done showed small bilateral pleural effusion and bibasilar atelectasis versus infiltrate along with pulmonary nodules.  Bowel loops concerning for ileus. CT of the head was done which was negative for acute findings.  CT of the chest without contrast given pulmonary nodules showed bilateral pulmonary nodules with mediastinal  and retroperitoneal adenopathy my hepatic lesions, omental and peritoneal implants and bilateral pleural effusion along with ascites.  Findings are concerning for metastatic disease of unknown primary.  Also found to have large pericardial effusion measuring 3 cm in  thickness.  Patient admitted to telemetry for further management.  2D echo done showed large pericardial effusion with mild tamponade physiology.  Discussed with cardiology with plan to transfer to [facility] for close monitoring.  Assessment & Plan:   Principal Problem:   Community acquired pneumonia Active Problems:   Prostate cancer ([place])   Essential hypertension   AKI (acute kidney injury) ([place])   Hyponatremia   Hypokalemia   Ileus ([facility])   Lactic acidosis   Pulmonary nodule   Pneumonia   Pleural effusion   Pericardial effusion   Pericardial tamponade  Principal problem Pericardial effusion -2D echo with findings of large pericardial effusion with borderline tamponade physiology.  Clinically no signs of cardiac tamponade.   -Patient was transferred to [facility] from [place] for closer monitoring -Cardiology is following. Repeat 2d limited echo performed 7/22 with concerns of increasing pericardial effusion with tamonade -Discussed case with Cardiology -CTS has been consulted with plans for pericardial window -Presently on minimal O2 support -Pleural fluid cytology pending  Pulmonary nodules with bilateral pleural effusions and possible pneumonia -Patient is continued on empiric Rocephin and azithromycin. -presently on minimal O2 requirements as per above -patient is s/p thoracentesis yielding just over 300cc fluid,  -Labs reviewed. Cytology remains pending  Diffuse metastatic disease with unknown primary.   -Patient with bilateral pulmonary, retroperitoneal, mediastinal lymph nodes with hepatic lesions suggestive of diffuse metastatic disease of unknown primary. -Will likely require Oncology consultation, awaiting cytology results -Patient is currently continued on gentle IVF hydration.  -Consider formal metastatic survey when patient is more stable  Acute kidney injury -  Suspect prerenal.   -Continuing gentle IVF hydration.  -Labs reviewed. Renal  function improved -Would repeat bmet in AM  Hyponatremia -Possible prerenal versus SIADH with pneumonia/malignancy.  -Improvement noted with hydration.   -continued on basal IVF at 75cc/hr  Elevated lactic acid -Clinically not septic -Most recent levels had improved  Tobacco abuse -Counseled strongly on cessation. Done at bedside -Smokes 1/2 pack/day.  -will continue on nicotine patch  Alcohol abuse -No evidence of withdrawals this morning -Pt has been continue on CIWA.  Anxiety and depression -appears stable at present -Patient reports not tolerating klonopin prior to admit and had not been taking -appears stable at this time   DVT prophylaxis: heparin subq Code Status: Full Family Communication: Pt in room, family not at bedside Disposition Plan: Uncertain at this time  Consultants:   Cardiology  IR  CTS  Procedures:   Thoracentesis 7/19  Antimicrobials: Anti-infectives (From admission, onward)   Start     Dose/Rate Route Frequency Ordered Stop   08/06/17 0200  cefTRIAXone (ROCEPHIN) 1 g in sodium chloride 0.9 % 100 mL IVPB     1 g 200 mL/hr over 30 Minutes Intravenous Every 24 hours 08/05/17 0257     08/06/17 0200  azithromycin (ZITHROMAX) 250 mg in dextrose 5 % 125 mL IVPB  Status:  Discontinued     250 mg 125 mL/hr over 60 Minutes Intravenous Every 24 hours 08/05/17 0257 08/05/17 1333   08/06/17 0200  azithromycin (ZITHROMAX) 250 mg in sodium chloride 0.9 % 100 mL injection      Intravenous Every 24 hours 08/05/17 1334     08/05/17 0115  cefTRIAXone (ROCEPHIN) 1 g in sodium chloride 0.9 % 100 mL IVPB     1 g 200 mL/hr over 30 Minutes Intravenous  Once 08/05/17 0113 08/05/17 0244   08/05/17 0115  azithromycin (ZITHROMAX) 500 mg in sodium chloride 0.9 % 250 mL IVPB     500 mg 250 mL/hr over 60 Minutes Intravenous  Once 08/05/17 0113 08/05/17 0317      Subjective: No chest pains. Eager to go home soon  Objective: Vitals:   08/07/17 1411  08/08/17 0001 08/08/17 0547 08/08/17 1227  BP: 103/72 96/70 95/70  99/72  Pulse: 75 70 71 72  Resp: 16 18 17 17   Temp: 97.7 F (36.5 C)  (!) 97.4 F (36.3 C) 97.8 F (36.6 C)  TempSrc: Oral  Oral Oral  SpO2: 99% 100% 98% 98%  Weight:      Height:        Intake/Output Summary (Last 24 hours) at 08/08/2017 1439 Last data filed at 08/08/2017 1400 Gross per 24 hour  Intake 3562.44 ml  Output 400 ml  Net 3162.44 ml   Filed Weights   08/05/17 0348 08/05/17 2011 08/06/17 0511  Weight: 69 kg (152 lb 1.9 oz) 69 kg (152 lb 1.9 oz) 69 kg (152 lb 1.9 oz)    Examination: General exam: Awake, laying in bed, in nad Respiratory system: Normal respiratory effort, no wheezing Cardiovascular system: regular rate, s1, s2 Gastrointestinal system: Soft, nondistended, positive BS Central nervous system: CN2-12 grossly intact, strength intact Extremities: Perfused, no clubbing Skin: Normal skin turgor, no notable skin lesions seen Psychiatry: Mood normal // no visual hallucinations   Data Reviewed: I have personally reviewed following labs and imaging studies  CBC: Recent Labs  Lab 08/04/17 2312 08/07/17 0342  WBC 13.8* 12.1*  HGB 12.6* 11.2*  HCT 37.0* 34.8*  MCV 84.5 86.8  PLT 236 168  Basic Metabolic Panel: Recent Labs  Lab 08/04/17 2312 08/05/17 1209 08/06/17 0836 08/07/17 0342 08/08/17 0501  NA 125* 128* 127* 127* 128*  K 3.1* 3.7 3.8 3.8 3.8  CL 87* 94* 92* 93* 95*  CO2 21* 18* 18* 17* 17*  GLUCOSE 117* 112* 98 92 90  BUN 54* 59* 66* 71* 74*  CREATININE 2.16* 2.03* 2.10* 2.06* 2.06*  CALCIUM 8.7* 8.3* 8.5* 8.5* 8.7*  MG 2.5*  --   --   --   --    GFR: Estimated Creatinine Clearance: 34.1 mL/min (A) (by C-G formula based on SCr of 2.06 mg/dL (H)). Liver Function Tests: Recent Labs  Lab 08/04/17 2312 08/07/17 0342 08/08/17 0501  AST 42* 72* 65*  ALT 20 33 37  ALKPHOS 262* 258* 271*  BILITOT 1.6* 1.3* 1.2  PROT 7.3 6.4* 5.7*  ALBUMIN 2.9* 2.4* 2.4*   No  results for input(s): LIPASE, AMYLASE in the last 168 hours. Recent Labs  Lab 08/04/17 2312  AMMONIA 10   Coagulation Profile: No results for input(s): INR, PROTIME in the last 168 hours. Cardiac Enzymes: Recent Labs  Lab 08/04/17 2312  TROPONINI <0.03   BNP (last 3 results) No results for input(s): PROBNP in the last 8760 hours. HbA1C: No results for input(s): HGBA1C in the last 72 hours. CBG: Recent Labs  Lab 08/04/17 2318  GLUCAP 120*   Lipid Profile: No results for input(s): CHOL, HDL, LDLCALC, TRIG, CHOLHDL, LDLDIRECT in the last 72 hours. Thyroid Function Tests: No results for input(s): TSH, T4TOTAL, FREET4, T3FREE, THYROIDAB in the last 72 hours. Anemia Panel: No results for input(s): VITAMINB12, FOLATE, FERRITIN, TIBC, IRON, RETICCTPCT in the last 72 hours. Sepsis Labs: Recent Labs  Lab 08/04/17 2312 08/05/17 0122 08/05/17 0631  LATICACIDVEN 3.8* 4.2* 3.2*    Recent Results (from the past 240 hour(s))  Culture, body fluid-bottle     Status: None (Preliminary result)   Collection Time: 08/05/17  1:31 PM  Result Value Ref Range Status   Specimen Description ASCITIC  Final   Special Requests BOTTLES DRAWN AEROBIC AND ANAEROBIC 10 CC EACH  Final   Culture   Final    NO GROWTH 3 DAYS Performed at [facility], [address] [place], [place] [address]    Report Status PENDING  Incomplete  Gram stain     Status: None   Collection Time: 08/05/17  1:31 PM  Result Value Ref Range Status   Specimen Description PLEURAL  Final   Special Requests NONE  Final   Gram Stain   Final    CYTOSPIN SMEAR NO ORGANISMS SEEN WBC PRESENT, PREDOMINANTLY MONONUCLEAR Performed at [facility], [address] [place],  [address]    Report Status 08/05/2017 FINAL  Final     Radiology Studies: No results found.  Scheduled Meds: . bisacodyl  10 mg Rectal Once  . chlorhexidine  15 mL Mouth Rinse BID  . dextromethorphan-guaiFENesin  1 tablet Oral BID  . feeding  supplement  1 Container Oral TID BM  . fluticasone  1 spray Each Nare Daily  . folic acid  1 mg Oral Daily  . mouth rinse  15 mL Mouth Rinse q12n4p  . multivitamin with minerals  1 tablet Oral Daily  . nicotine  21 mg Transdermal Daily  . oxybutynin  5 mg Oral BID  . sertraline  100 mg Oral Daily  . tamsulosin  0.4 mg Oral Daily  . thiamine  100 mg Oral Daily  . topiramate  100 mg Oral BID   Continuous Infusions: . sodium chloride 10 mL/hr at 08/06/17 1051  . small volume/piggyback builder Stopped (08/08/17 0700)  . cefTRIAXone (ROCEPHIN)  IV Stopped (08/08/17 0131)  . lactated ringers 100 mL/hr at 08/08/17 1400     LOS: 3 days   Marylu Lund, MD [facility] Pager [phone]  If 7PM-7AM, please contact night-coverage [email or web address] Password [facility] 08/08/2017, 2:39 PM

## 2017-08-08 NOTE — Anesthesia Preprocedure Evaluation (Addendum)
Anesthesia Evaluation  Patient identified by MRN, date of birth, ID band Patient awake    Reviewed: Allergy & Precautions, NPO status , Patient's Chart, lab work & pertinent test results  History of Anesthesia Complications Negative for: history of anesthetic complications  Airway Mallampati: I  TM Distance: >3 FB Neck ROM: Full    Dental  (+) Poor Dentition, Loose, Missing, Dental Advisory Given   Pulmonary pneumonia, Current Smoker,    Pulmonary exam normal        Cardiovascular hypertension, Pt. on medications Normal cardiovascular exam  Study Conclusions  - Left ventricle: The cavity size was normal. Systolic function was   normal. The estimated ejection fraction was in the range of 55%   to 60%. Wall motion was normal; there were no regional wall   motion abnormalities. - Inferior vena cava: The vessel was dilated. The respirophasic   diameter changes were blunted (< 50%), consistent with elevated   central venous pressure. - Pericardium, extracardiac: A large, free-flowing pericardial   effusion was identified circumferential to the heart. The fluid   had no internal echoes. Multiple small mobile echodensities were attached to the epicardial surface. There was mildright atrial chamber collapse for less than 50% of the cardiac cycle.  Respirophasic change in stroke volume was excessive. Features were consistent with tamponade physiology.   Neuro/Psych negative neurological ROS  negative psych ROS   GI/Hepatic negative GI ROS, (+)     substance abuse  alcohol use,   Endo/Other  negative endocrine ROS  Renal/GU ARFRenal disease     Musculoskeletal   Abdominal   Peds  Hematology negative hematology ROS (+)   Anesthesia Other Findings   Reproductive/Obstetrics                           Anesthesia Physical Anesthesia Plan  ASA: IV and emergent  Anesthesia Plan: General   Post-op  Pain Management:    Induction: Intravenous  PONV Risk Score and Plan: 2 and Ondansetron and Dexamethasone  Airway Management Planned: Oral ETT  Additional Equipment: Arterial line, CVP, Ultrasound Guidance Line Placement and 3D TEE  Intra-op Plan:   Post-operative Plan: Extubation in OR  Informed Consent: I have reviewed the patients History and Physical, chart, labs and discussed the procedure including the risks, benefits and alternatives for the proposed anesthesia with the patient or authorized representative who has indicated his/her understanding and acceptance.   Dental advisory given  Plan Discussed with: Anesthesiologist and CRNA  Anesthesia Plan Comments:         Anesthesia Quick Evaluation

## 2017-08-08 NOTE — Progress Notes (Signed)
Physical Therapy Treatment Patient Details Name: [name] [name] MRN: [id number] DOB: Dec 05, 1950 Today's Date: 08/08/2017    History of Present Illness [name] [name] is a 67 y.o. male with medical history significant for prostate cancer-not currently on treatment, prior history of alcohol abuse, hypertension, anxiety/depression, and essential tremors who presents to the emergency department with vague symptoms of sinus congestion as well as some weakness and disequilibrium.  He also states that he has lost appetite because he cannot have a bowel movement and cannot state when his last bowel movement was.  He is a poor historian, but seems to think that he cannot cough up phlegm and appears to think that he is congested and "clogged up."    PT Comments    Pt with very slow labored movements requiring increased time for all mobilities. Pt able to sit EOB for ~10 min with min guard. Unable to complete transfer to chair as lab arrived during treatment and needed pt supine for echo. Patient would benefit from continued skilled PT to maximize activity tolerance and functional independnece. Will continue to follow acutely.     Follow Up Recommendations  SNF;Supervision for mobility/OOB     Equipment Recommendations  Rolling walker with 5" wheels    Recommendations for Other Services       Precautions / Restrictions Precautions Precautions: Fall Restrictions Weight Bearing Restrictions: No    Mobility  Bed Mobility Overal bed mobility: Needs Assistance Bed Mobility: Sit to Supine;Supine to Sit     Supine to sit: Min assist Sit to supine: Min guard   General bed mobility comments: Min A to initiate movement and elevate torso. No assist required to return to bed. Pt with very slow labored movements.  Transfers Overall transfer level: Needs assistance Equipment used: Rolling walker (2 wheeled) Transfers: Sit to/from [facility] Sit to Stand: Min assist          General transfer comment: Two attempts required to come to full standing. Min A and cues for hand placement with RW  Ambulation/Gait             General Gait Details: deferred at lab arrived to perform echo during treatment.    Stairs             Wheelchair Mobility    Modified Rankin (Stroke Patients Only)       Balance Overall balance assessment: Needs assistance Sitting-balance support: Feet supported;No upper extremity supported Sitting balance-Leahy Scale: Good Sitting balance - Comments: pt sat EOB for ~10 min. Required mod A to don pants in sitting.   Standing balance support: No upper extremity supported;During functional activity Standing balance-Leahy Scale: Poor                              Cognition Arousal/Alertness: Awake/alert Behavior During Therapy: WFL for tasks assessed/performed Overall Cognitive Status: Within Functional Limits for tasks assessed                                        Exercises      General Comments General comments (skin integrity, edema, etc.): Pt with c/o SOB however Spo2 at 99% on 2L O2. Treatment inturrupted by lab needing to perform echo. SPT and ambulation deffered as pt needed to be supine for procedure.      Pertinent Vitals/Pain Pain Assessment: No/denies pain  Home Living                      Prior Function            PT Goals (current goals can now be found in the care plan section) Acute Rehab PT Goals Patient Stated Goal: return home able to walk safely PT Goal Formulation: With patient Time For Goal Achievement: 08/22/17 Potential to Achieve Goals: Good Progress towards PT goals: Progressing toward goals    Frequency    Min 3X/week      PT Plan Current plan remains appropriate    Co-evaluation              AM-PAC PT "6 Clicks" Daily Activity  Outcome Measure  Difficulty turning over in bed (including adjusting bedclothes, sheets and  blankets)?: None Difficulty moving from lying on back to sitting on the side of the bed? : None Difficulty sitting down on and standing up from a chair with arms (e.g., wheelchair, bedside commode, etc,.)?: A Little Help needed moving to and from a bed to chair (including a wheelchair)?: A Little Help needed walking in hospital room?: A Lot Help needed climbing 3-5 steps with a railing? : A Lot 6 Click Score: 18    End of Session Equipment Utilized During Treatment: Gait belt Activity Tolerance: Patient tolerated treatment well;Patient limited by fatigue Patient left: in bed;with bed alarm set;with call bell/phone within reach(lab present) Nurse Communication: Mobility status PT Visit Diagnosis: Unsteadiness on feet (R26.81);Other abnormalities of gait and mobility (R26.89);Muscle weakness (generalized) (M62.81)     Time: 4401-0272 PT Time Calculation (min) (ACUTE ONLY): 27 min  Charges:  $Therapeutic Activity: 23-37 mins                    G Codes:      Benjiman Core, [place] Pager [phone] Acute Rehab   Allena Katz 08/08/2017, 11:18 AM

## 2017-08-08 NOTE — Progress Notes (Signed)
Progress Note  Patient Name: [name] [name] Date of Encounter: 08/08/2017  Primary Cardiologist: Carlyle Dolly, MD   Subjective   Still complains of SOB but no worse than yesterday.  Denies any CP  Inpatient Medications    Scheduled Meds: . amLODipine  5 mg Oral Daily  . bisacodyl  10 mg Rectal Once  . chlorhexidine  15 mL Mouth Rinse BID  . dextromethorphan-guaiFENesin  1 tablet Oral BID  . feeding supplement  1 Container Oral TID BM  . fluticasone  1 spray Each Nare Daily  . folic acid  1 mg Oral Daily  . heparin  5,000 Units Subcutaneous Q8H  . mouth rinse  15 mL Mouth Rinse q12n4p  . multivitamin with minerals  1 tablet Oral Daily  . nicotine  21 mg Transdermal Daily  . oxybutynin  5 mg Oral BID  . sertraline  100 mg Oral Daily  . tamsulosin  0.4 mg Oral Daily  . thiamine  100 mg Oral Daily  . topiramate  100 mg Oral BID   Continuous Infusions: . sodium chloride 10 mL/hr at 08/06/17 1051  . small volume/piggyback builder 0 mL/hr at 08/07/17 0700  . cefTRIAXone (ROCEPHIN)  IV Stopped (08/08/17 0131)  . lactated ringers 100 mL/hr at 08/08/17 0700   PRN Meds: acetaminophen **OR** acetaminophen, bisacodyl, guaiFENesin-dextromethorphan, ipratropium-albuterol, LORazepam **OR** LORazepam, ondansetron **OR** ondansetron (ZOFRAN) IV   Vital Signs    Vitals:   08/07/17 1200 08/07/17 1411 08/08/17 0001 08/08/17 0547  BP:  103/72 96/70 95/70   Pulse:  75 70 71  Resp:  16 18 17   Temp: (!) 97 F (36.1 C) 97.7 F (36.5 C)  (!) 97.4 F (36.3 C)  TempSrc:  Oral  Oral  SpO2:  99% 100% 98%  Weight:      Height:        Intake/Output Summary (Last 24 hours) at 08/08/2017 0847 Last data filed at 08/08/2017 0807 Gross per 24 hour  Intake 2802.68 ml  Output 400 ml  Net 2402.68 ml   Filed Weights   08/05/17 0348 08/05/17 2011 08/06/17 0511  Weight: 152 lb 1.9 oz (69 kg) 152 lb 1.9 oz (69 kg) 152 lb 1.9 oz (69 kg)    Telemetry    NSR with occasional PVCs -  Personally Reviewed  ECG    No new EKG to review - Personally Reviewed  Physical Exam   GEN: No acute distress.   Neck: No JVD Cardiac: RRR, no murmurs, rubs, or gallops.  Respiratory: Clear to auscultation bilaterally. GI: Soft, nontender, non-distended  MS: No edema; No deformity. Neuro:  Nonfocal  Psych: Normal affect   Labs    Chemistry Recent Labs  Lab 08/04/17 2312  08/06/17 0836 08/07/17 0342 08/08/17 0501  NA 125*   < > 127* 127* 128*  K 3.1*   < > 3.8 3.8 3.8  CL 87*   < > 92* 93* 95*  CO2 21*   < > 18* 17* 17*  GLUCOSE 117*   < > 98 92 90  BUN 54*   < > 66* 71* 74*  CREATININE 2.16*   < > 2.10* 2.06* 2.06*  CALCIUM 8.7*   < > 8.5* 8.5* 8.7*  PROT 7.3  --   --  6.4* 5.7*  ALBUMIN 2.9*  --   --  2.4* 2.4*  AST 42*  --   --  72* 65*  ALT 20  --   --  33 37  ALKPHOS 262*  --   --  258* 271*  BILITOT 1.6*  --   --  1.3* 1.2  GFRNONAA 30*   < > 31* 32* 32*  GFRAA 35*   < > 36* 37* 37*  ANIONGAP 17*   < > 17* 17* 16*   < > = values in this interval not displayed.     Hematology Recent Labs  Lab 08/04/17 2312 08/07/17 0342  WBC 13.8* 12.1*  RBC 4.38 4.01*  HGB 12.6* 11.2*  HCT 37.0* 34.8*  MCV 84.5 86.8  MCH 28.8 27.9  MCHC 34.1 32.2  RDW 14.2 14.4  PLT 236 168    Cardiac Enzymes Recent Labs  Lab 08/04/17 2312  TROPONINI <0.03   No results for input(s): TROPIPOC in the last 168 hours.   BNPNo results for input(s): BNP, PROBNP in the last 168 hours.   DDimer No results for input(s): DDIMER in the last 168 hours.   Radiology    No results found.  Cardiac Studies   Echo 08/05/2017 LV EF: 50% - 55% Study Conclusions  - Left ventricle: The cavity size was normal. Wall thickness was increased in a pattern of moderate LVH. Systolic function was normal. The estimated ejection fraction was in the range of 50% to 55%. Wall motion was normal; there were no regional wall motion abnormalities. Doppler parameters are consistent  with abnormal left ventricular relaxation (grade 1 diastolic dysfunction). Doppler parameters are consistent with high ventricular filling pressure. - Aortic valve: Mildly calcified annulus. Trileaflet; mildly thickened leaflets. Valve area (VTI): 2.89 cm^2. Valve area (Vmax): 2.24 cm^2. - Mitral valve: Mildly calcified annulus. Mildly thickened leaflets . - Inferior vena cava: The vessel was normal in size. The respirophasic diameter changes were in the normal range (= 50%), consistent with normal central venous pressure. - Pericardium, extracardiac: There is a large circumferential pericardial effusion, most prominent adjacent to the RV measuring 2.7 cm in diastole. Respiratory variation across the MV is approximately 34%. Variation across TV is not interpretable. There is buckling of the RA without frank collapse. No clear RV collapse. The IVC appears normal measuring 1.9 cm, with normal variation. (Not seen on 2D but noted on M Mode) Overall mixed criteria for tamponade physiology. - Technically adequate study. - Recommend repeat echo in 24 to 48 hours to reassess effusion.   Patient Profile     67 y.o. male who was found to have AKI, multiple nodules on CT concerning for metastatic disease of unknown primary source, and large pericardial effusion  Assessment & Plan    1. Pericardial effusion             - in the setting of probable diffuse metastatic disease on CT             - large pericardial effusion with borderline tamponade physiology.             - will get limited echo today to determine if effusion has increased in size. May need pericardial window if size is larger (borderline mixed tamponade physiology on last echo)  - BP remains soft despite stopping Clonidine but is not tachycardic  2. Pleural effusion: underwent thoracentesis on 08/05/2017 with removal of 360 ml of yellow colored fluid.   3. Lung nodule: newly diagnosed, likely  metastatic cancer of unknown primary source.   4. AKI -creatinine stable at 2.06 (peaked at 2.16) -continue to follow  5. Hyponatremia -sodium stable at 128  6.  HTN -BP remains soft after stopping Clonidine -will hold amlodipine for now  For questions or updates, please contact [place] Please consult [email or web address] for contact info under Cardiology/STEMI.      Signed, Fransico Him, MD  08/08/2017, 8:47 AM

## 2017-08-09 ENCOUNTER — Encounter (HOSPITAL_COMMUNITY): Payer: Self-pay | Admitting: Thoracic Surgery (Cardiothoracic Vascular Surgery)

## 2017-08-09 ENCOUNTER — Inpatient Hospital Stay (HOSPITAL_COMMUNITY): Payer: Medicare HMO

## 2017-08-09 DIAGNOSIS — I314 Cardiac tamponade: Secondary | ICD-10-CM

## 2017-08-09 LAB — CBC
HCT: 32.5 % — ABNORMAL LOW (ref 39.0–52.0)
Hemoglobin: 10.6 g/dL — ABNORMAL LOW (ref 13.0–17.0)
MCH: 28.6 pg (ref 26.0–34.0)
MCHC: 32.6 g/dL (ref 30.0–36.0)
MCV: 87.8 fL (ref 78.0–100.0)
PLATELETS: 122 10*3/uL — AB (ref 150–400)
RBC: 3.7 MIL/uL — ABNORMAL LOW (ref 4.22–5.81)
RDW: 15.9 % — ABNORMAL HIGH (ref 11.5–15.5)
WBC: 8 10*3/uL (ref 4.0–10.5)

## 2017-08-09 LAB — URINALYSIS, ROUTINE W REFLEX MICROSCOPIC
Bilirubin Urine: NEGATIVE
GLUCOSE, UA: NEGATIVE mg/dL
Ketones, ur: NEGATIVE mg/dL
NITRITE: NEGATIVE
PH: 5 (ref 5.0–8.0)
Protein, ur: NEGATIVE mg/dL
SPECIFIC GRAVITY, URINE: 1.02 (ref 1.005–1.030)

## 2017-08-09 LAB — ACID FAST SMEAR (AFB): ACID FAST SMEAR - AFSCU2: NEGATIVE

## 2017-08-09 LAB — GLUCOSE, BODY FLUID OTHER: GLUCOSE, BODY FLUID OTHER: 16 mg/dL

## 2017-08-09 LAB — MAGNESIUM: MAGNESIUM: 2.5 mg/dL — AB (ref 1.7–2.4)

## 2017-08-09 LAB — BASIC METABOLIC PANEL
Anion gap: 11 (ref 5–15)
BUN: 62 mg/dL — AB (ref 8–23)
CALCIUM: 8.2 mg/dL — AB (ref 8.9–10.3)
CO2: 21 mmol/L — ABNORMAL LOW (ref 22–32)
CREATININE: 1.63 mg/dL — AB (ref 0.61–1.24)
Chloride: 98 mmol/L (ref 98–111)
GFR calc Af Amer: 49 mL/min — ABNORMAL LOW (ref >60)
GFR, EST NON AFRICAN AMERICAN: 42 mL/min — AB (ref >60)
Glucose, Bld: 100 mg/dL — ABNORMAL HIGH (ref 70–99)
Potassium: 3.7 mmol/L (ref 3.5–5.1)
Sodium: 130 mmol/L — ABNORMAL LOW (ref 135–145)

## 2017-08-09 LAB — TYPE AND SCREEN
ABO/RH(D): O NEG
ANTIBODY SCREEN: NEGATIVE

## 2017-08-09 LAB — MRSA PCR SCREENING: MRSA by PCR: NEGATIVE

## 2017-08-09 LAB — PROTEIN, BODY FLUID (OTHER): Total Protein, Body Fluid Other: 3.7 g/dL

## 2017-08-09 LAB — ACID FAST SMEAR (AFB, MYCOBACTERIA)

## 2017-08-09 LAB — LD, BODY FLUID (OTHER): LD, Body Fluid: 1977 IU/L

## 2017-08-09 MED ORDER — LACTULOSE 10 GM/15ML PO SOLN
10.0000 g | ORAL | Status: AC
  Administered 2017-08-09: 10 g via ORAL
  Filled 2017-08-09: qty 15

## 2017-08-09 MED ORDER — LORAZEPAM 2 MG/ML IJ SOLN
0.5000 mg | INTRAMUSCULAR | Status: AC | PRN
  Administered 2017-08-10: 0.5 mg via INTRAVENOUS

## 2017-08-09 MED ORDER — DEXAMETHASONE 4 MG PO TABS
4.0000 mg | ORAL_TABLET | Freq: Four times a day (QID) | ORAL | Status: DC
  Administered 2017-08-09 – 2017-08-11 (×8): 4 mg via ORAL
  Filled 2017-08-09 (×10): qty 1

## 2017-08-09 MED ORDER — LEVETIRACETAM IN NACL 500 MG/100ML IV SOLN
500.0000 mg | Freq: Two times a day (BID) | INTRAVENOUS | Status: DC
  Administered 2017-08-09 – 2017-08-11 (×6): 500 mg via INTRAVENOUS
  Filled 2017-08-09 (×7): qty 100

## 2017-08-09 MED ORDER — KCL IN DEXTROSE-NACL 20-5-0.9 MEQ/L-%-% IV SOLN
INTRAVENOUS | Status: DC
  Administered 2017-08-09 – 2017-08-11 (×3): via INTRAVENOUS
  Filled 2017-08-09 (×3): qty 1000

## 2017-08-09 MED ORDER — LORAZEPAM 2 MG/ML IJ SOLN
2.0000 mg | INTRAMUSCULAR | Status: DC | PRN
  Filled 2017-08-09: qty 1

## 2017-08-09 NOTE — Progress Notes (Signed)
PROGRESS NOTE    [name] [name]  UVO:[id number] DOB: Mar 21, 1950 DOA: 08/04/2017 PCP: [name], MD    Brief Narrative:  67 year old male with history of prostate cancer (reports getting radiation therapy), history of alcohol abuse (reports not having a drink for past few weeks, used to drink at least 6 packs of beer daily), heavy smoker, hypertension, anxiety/depression and essential tremors who presented to the ED with sinus congestion, weakness and unsteady gait for past few weeks which has gotten worse in the past few days.  He also reports very poor appetite and per his ex-wife at bedside he has lost probably about 100 pounds since he last saw him 3-4 months ago.  Patient is a poor historian.  He complained that he was having some sinus congestion and cough being unable to produce any phlegm.  He also reported some shortness of breath.  He denied any fevers, chills, chest pain, orthopnea, PND, nausea, vomiting, abdominal pain, dysuria, diarrhea, tingling or numbness of his extremities.  In the ED his vitals were stable but labs showed WBC of 13 point 8K hemoglobin of 12.6, platelets of 236, sodium of 125, potassium of 3.1, AKI with BUN of 54 and creatinine of 2.16 calcium of 8.7, AST of 42, alkaline phosphatase of 262, bilirubin of 1.6 and lactic acid of 4.2 which subsequently improved to 3.2.  Her chest x-ray done showed small bilateral pleural effusion and bibasilar atelectasis versus infiltrate along with pulmonary nodules.  Bowel loops concerning for ileus. CT of the head was done which was negative for acute findings.  CT of the chest without contrast given pulmonary nodules showed bilateral pulmonary nodules with mediastinal  and retroperitoneal adenopathy my hepatic lesions, omental and peritoneal implants and bilateral pleural effusion along with ascites.  Findings are concerning for metastatic disease of unknown primary.  Also found to have large pericardial effusion measuring 3 cm in  thickness.  Patient admitted to telemetry for further management.  2D echo done showed large pericardial effusion with mild tamponade physiology.  Discussed with cardiology with plan to transfer to [facility] for close monitoring.  Assessment & Plan:   Principal Problem:   Pericardial tamponade Active Problems:   Prostate cancer ([place])   Essential hypertension   AKI (acute kidney injury) ([place])   Hyponatremia   Hypokalemia   Ileus ([facility])   Lactic acidosis   Community acquired pneumonia   Pulmonary nodule   Pneumonia   Pleural effusion   Pericardial effusion  Principal problem Pericardial effusion -2D echo with findings of large pericardial effusion with borderline tamponade physiology.  Clinically no signs of cardiac tamponade.   -Patient was transferred to [facility] from [place] for closer monitoring -Cardiology is following. Repeat 2d limited echo performed 7/22 with concerns of increasing pericardial effusion with tamonade -CTS was consulted and patient underwent pericardial window on 7/22 -Labs reviewed, pleural fluid cytology remains pending this AM  Pulmonary nodules with bilateral pleural effusions and possible pneumonia -Patient is continued on empiric Rocephin and azithromycin. -presently on minimal O2 requirements as per above -patient is s/p thoracentesis yielding just over 300cc fluid,  -Labs reviewed. Cytology still pending this AM. If unequivocal, then consider IR bipsy.   Diffuse metastatic disease with unknown primary.   -Patient with bilateral pulmonary, retroperitoneal, mediastinal lymph nodes with hepatic lesions suggestive of diffuse metastatic disease of unknown primary. -Will likely require Oncology consultation, awaiting cytology results -Patient is currently continued on gentle IVF hydration.  -Would recommend formal metastatic survey when patient  is more stable  Acute kidney injury -Suspect prerenal in etiology -Patient was  continued on gentle IVF hydration.  -Labs reviewed. Renal function has now improved -Recheck bmet in AM  Hyponatremia -Possible prerenal versus SIADH with pneumonia/malignancy.  -Improvement noted with hydration.   -Patient continued on IVF hydration  Elevated lactic acid -Clinically not septic -Most recent levels had improved, labs reviewed  Tobacco abuse -Counseled strongly on cessation. Done at bedside -Smokes 1/2 pack/day prior to admission -Patient to continue nicotine patch  Alcohol abuse -No evidence of withdrawals this morning -Pt has been continue on CIWA. -Stable this AM  Anxiety and depression -appears stable at present -Patient reports not tolerating klonopin prior to admit and had not been taking -flat affect noted, suspect element of mild depression this AM  PAF -Patient noted to have afib RVR overnight following pericardial window -Patient has been continued on amiodarone gtt -Now in NSR -Cardiology following  DVT prophylaxis: heparin subq Code Status: Full Family Communication: Pt in room, family not at bedside Disposition Plan: Uncertain at this time  Consultants:   Cardiology  IR  CTS  Procedures:   Thoracentesis 7/19  Antimicrobials: Anti-infectives (From admission, onward)   Start     Dose/Rate Route Frequency Ordered Stop   08/08/17 1648  ceFAZolin (ANCEF) 2-4 GM/100ML-% IVPB    Note to Pharmacy:  Lisette Grinder   : cabinet override      08/08/17 1648 08/08/17 1730   08/08/17 1646  ceFAZolin (ANCEF) IVPB 2g/100 mL premix     2 g 200 mL/hr over 30 Minutes Intravenous 30 min pre-op 08/08/17 1646 08/08/17 1730   08/06/17 0200  cefTRIAXone (ROCEPHIN) 1 g in sodium chloride 0.9 % 100 mL IVPB     1 g 200 mL/hr over 30 Minutes Intravenous Every 24 hours 08/05/17 0257     08/06/17 0200  azithromycin (ZITHROMAX) 250 mg in dextrose 5 % 125 mL IVPB  Status:  Discontinued     250 mg 125 mL/hr over 60 Minutes Intravenous Every 24 hours  08/05/17 0257 08/05/17 1333   08/06/17 0200  azithromycin (ZITHROMAX) 250 mg in sodium chloride 0.9 % 100 mL injection      Intravenous Every 24 hours 08/05/17 1334     08/05/17 0115  cefTRIAXone (ROCEPHIN) 1 g in sodium chloride 0.9 % 100 mL IVPB     1 g 200 mL/hr over 30 Minutes Intravenous  Once 08/05/17 0113 08/05/17 0244   08/05/17 0115  azithromycin (ZITHROMAX) 500 mg in sodium chloride 0.9 % 250 mL IVPB     500 mg 250 mL/hr over 60 Minutes Intravenous  Once 08/05/17 0113 08/05/17 0317      Subjective: Without complaints this AM  Objective: Vitals:   08/09/17 0500 08/09/17 0600 08/09/17 0700 08/09/17 0800  BP: 111/63 102/64 97/71 (!) 89/63  Pulse: 62 65 64 (!) 57  Resp: 15 (!) 21 (!) 21 11  Temp:   (!) 96 F (35.6 C)   TempSrc:   Axillary   SpO2: 95% 97% 96% 95%  Weight:      Height:        Intake/Output Summary (Last 24 hours) at 08/09/2017 0833 Last data filed at 08/09/2017 0700 Gross per 24 hour  Intake 2994.37 ml  Output 2030 ml  Net 964.37 ml   Filed Weights   08/05/17 2011 08/06/17 0511 08/09/17 0400  Weight: 69 kg (152 lb 1.9 oz) 69 kg (152 lb 1.9 oz) 76 kg (167 lb 8.8 oz)  Examination: General exam: Conversant, in no acute distress Respiratory system: normal chest rise, clear, no audible wheezing Cardiovascular system: regular rhythm, s1-s2 Gastrointestinal system: Nondistended, nontender, pos BS Central nervous system: No seizures, no tremors Extremities: No cyanosis, no joint deformities Skin: No rashes, no pallor Psychiatry: Affect appears flatl // no auditory hallucinations   Data Reviewed: I have personally reviewed following labs and imaging studies  CBC: Recent Labs  Lab 08/04/17 2312 08/07/17 0342 08/09/17 0550  WBC 13.8* 12.1* 8.0  HGB 12.6* 11.2* 10.6*  HCT 37.0* 34.8* 32.5*  MCV 84.5 86.8 87.8  PLT 236 168 672*   Basic Metabolic Panel: Recent Labs  Lab 08/04/17 2312 08/05/17 1209 08/06/17 0836 08/07/17 0342 08/08/17 0501  08/09/17 0550  NA 125* 128* 127* 127* 128* 130*  K 3.1* 3.7 3.8 3.8 3.8 3.7  CL 87* 94* 92* 93* 95* 98  CO2 21* 18* 18* 17* 17* 21*  GLUCOSE 117* 112* 98 92 90 100*  BUN 54* 59* 66* 71* 74* 62*  CREATININE 2.16* 2.03* 2.10* 2.06* 2.06* 1.63*  CALCIUM 8.7* 8.3* 8.5* 8.5* 8.7* 8.2*  MG 2.5*  --   --   --   --  2.5*   GFR: Estimated Creatinine Clearance: 43.1 mL/min (A) (by C-G formula based on SCr of 1.63 mg/dL (H)). Liver Function Tests: Recent Labs  Lab 08/04/17 2312 08/07/17 0342 08/08/17 0501  AST 42* 72* 65*  ALT 20 33 37  ALKPHOS 262* 258* 271*  BILITOT 1.6* 1.3* 1.2  PROT 7.3 6.4* 5.7*  ALBUMIN 2.9* 2.4* 2.4*   No results for input(s): LIPASE, AMYLASE in the last 168 hours. Recent Labs  Lab 08/04/17 2312  AMMONIA 10   Coagulation Profile: Recent Labs  Lab 08/08/17 1417  INR 1.26   Cardiac Enzymes: Recent Labs  Lab 08/04/17 2312  TROPONINI <0.03   BNP (last 3 results) No results for input(s): PROBNP in the last 8760 hours. HbA1C: No results for input(s): HGBA1C in the last 72 hours. CBG: Recent Labs  Lab 08/04/17 2318  GLUCAP 120*   Lipid Profile: No results for input(s): CHOL, HDL, LDLCALC, TRIG, CHOLHDL, LDLDIRECT in the last 72 hours. Thyroid Function Tests: No results for input(s): TSH, T4TOTAL, FREET4, T3FREE, THYROIDAB in the last 72 hours. Anemia Panel: No results for input(s): VITAMINB12, FOLATE, FERRITIN, TIBC, IRON, RETICCTPCT in the last 72 hours. Sepsis Labs: Recent Labs  Lab 08/04/17 2312 08/05/17 0122 08/05/17 0631  LATICACIDVEN 3.8* 4.2* 3.2*    Recent Results (from the past 240 hour(s))  Culture, body fluid-bottle     Status: None (Preliminary result)   Collection Time: 08/05/17  1:31 PM  Result Value Ref Range Status   Specimen Description ASCITIC  Final   Special Requests BOTTLES DRAWN AEROBIC AND ANAEROBIC 10 CC EACH  Final   Culture   Final    NO GROWTH 4 DAYS Performed at [facility], [address],  [place], [place] [address]    Report Status PENDING  Incomplete  Gram stain     Status: None   Collection Time: 08/05/17  1:31 PM  Result Value Ref Range Status   Specimen Description PLEURAL  Final   Special Requests NONE  Final   Gram Stain   Final    CYTOSPIN SMEAR NO ORGANISMS SEEN WBC PRESENT, PREDOMINANTLY MONONUCLEAR Performed at [facility], [address], [place], [place] [address]    Report Status 08/05/2017 FINAL  Final  Acid Fast Smear (AFB)     Status: None  Collection Time: 08/05/17  1:31 PM  Result Value Ref Range Status   AFB Specimen Processing Concentration  Final   Acid Fast Smear Negative  Final    Comment: (NOTE) Performed At: [facility] [place], [place] [id number] Rush Farmer MD TZ:[id number]    Source (AFB) PLEURAL  Final    Comment: Performed at [facility], [address], [place], [place] [address]  Body fluid culture     Status: None (Preliminary result)   Collection Time: 08/08/17  5:29 PM  Result Value Ref Range Status   Specimen Description FLUID PERICARDIAL  Final   Special Requests NONE  Final   Gram Stain   Final    ABUNDANT WBC PRESENT,BOTH PMN AND MONONUCLEAR NO ORGANISMS SEEN Performed at [facility], [address], [place], [place] [address]    Culture PENDING  Incomplete   Report Status PENDING  Incomplete  MRSA PCR Screening     Status: None   Collection Time: 08/09/17  1:09 AM  Result Value Ref Range Status   MRSA by PCR NEGATIVE NEGATIVE Final    Comment:        The GeneXpert MRSA Assay (FDA approved for NASAL specimens only), is one component of a comprehensive MRSA colonization surveillance program. It is not intended to diagnose MRSA infection nor to guide or monitor treatment for MRSA infections. Performed at [facility], [place] [address], [place], [place] [address]      Radiology Studies: Dg Chest Port 1 View  Result Date: 08/09/2017 CLINICAL DATA:  Status post pericardial  window creation for pericardial effusion. History of prostate carcinoma EXAM: PORTABLE CHEST 1 VIEW COMPARISON:  August 08, 2017 FINDINGS: Pericardial drainage catheter position unchanged. Central catheter tip is in the superior vena cava. There is no appreciable pneumothorax. There are pleural effusions bilaterally with bibasilar consolidation, stable. There is stable cardiomegaly with pulmonary venous hypertension. No adenopathy. No evident bone lesions. There is aortic atherosclerosis. IMPRESSION: Tube and catheter positions as described. No evident pneumothorax. There is underlying pulmonary vascular congestion. There are pleural effusions bilaterally which appear to be layering. There is consolidation in both lung bases. No new opacity. There is aortic atherosclerosis. Aortic Atherosclerosis (ICD10-I70.0). Electronically Signed   By: Lowella Grip III M.D.   On: 08/09/2017 07:04   Dg Chest Port 1 View  Result Date: 08/08/2017 CLINICAL DATA:  Pericardial effusion. EXAM: PORTABLE CHEST 1 VIEW COMPARISON:  Radiographs of August 05, 2017. FINDINGS: Stable cardiomegaly. No pneumothorax is noted. Right internal jugular catheter is noted with tip in expected position of the SVC. Mild bibasilar opacities are noted concerning for atelectasis and possible associated pleural effusions. Catheter is seen positioned over the cardiac apex consistent with pericardial drainage catheter. Bony thorax is unremarkable. IMPRESSION: Mild bibasilar opacities are noted concerning for subsegmental listhesis with associated pleural effusions. Interval placement of probable pericardial drainage catheter seen over cardiac apex. Right internal jugular catheter is noted with distal tip in expected position of the SVC. Electronically Signed   By: Marijo Conception, M.D.   On: 08/08/2017 19:29    Scheduled Meds: . bisacodyl  10 mg Rectal Once  . chlorhexidine  15 mL Mouth Rinse BID  . dextromethorphan-guaiFENesin  1 tablet Oral BID  .  feeding supplement  1 Container Oral TID BM  . fluticasone  1 spray Each Nare Daily  . folic acid  1 mg Oral Daily  . mouth rinse  15 mL Mouth Rinse q12n4p  .  multivitamin with minerals  1 tablet Oral Daily  . nicotine  21 mg Transdermal Daily  . oxybutynin  5 mg Oral BID  . sertraline  100 mg Oral Daily  . tamsulosin  0.4 mg Oral Daily  . thiamine  100 mg Oral Daily  . topiramate  100 mg Oral BID  . traMADol  50 mg Oral Q6H   Continuous Infusions: . amiodarone 30 mg/hr (08/09/17 0700)  . small volume/piggyback builder 0 mL/hr at 08/08/17 0700  . cefTRIAXone (ROCEPHIN)  IV Stopped (08/09/17 0300)  . diltiazem (CARDIZEM) infusion Stopped (08/08/17 2044)  . lactated ringers Stopped (08/09/17 9872)     LOS: 4 days   Marylu Lund, MD [facility] Pager [phone]  If 7PM-7AM, please contact night-coverage [email or web address] Password [facility] 08/09/2017, 8:33 AM

## 2017-08-09 NOTE — Progress Notes (Signed)
Patient having violent rhythmic tremors bilateral arms becoming more forceful and faster. Eyes roll back in head then he starts saying "Hi Hi Hi Hi" then eyes roll back in head again. This lasts for about 3 minutes. He is then able to communicate right after this event. His family says he has had 4 of these this am. Dr. Wyline Copas notified and orders received.

## 2017-08-09 NOTE — Anesthesia Postprocedure Evaluation (Signed)
Anesthesia Post Note  Patient: [name] [name]  Procedure(s) Performed: SUBXYPHOID PERICARDIAL WINDOW (N/A Chest) TRANSESOPHAGEAL ECHOCARDIOGRAM (TEE)     Patient location during evaluation: PACU Anesthesia Type: General Level of consciousness: sedated Pain management: pain level controlled Vital Signs Assessment: post-procedure vital signs reviewed and stable Respiratory status: spontaneous breathing and respiratory function stable Cardiovascular status: stable Postop Assessment: no apparent nausea or vomiting Anesthetic complications: no                 Andilyn Bettcher DANIEL

## 2017-08-09 NOTE — Progress Notes (Signed)
Progress Note  Patient Name: [name] [name] Date of Encounter: 08/09/2017  Primary Cardiologist: Carlyle Dolly, MD   Subjective   S/P subxyphoid pericardial window yesterday with > 500cc bloody fluid drained. Went into afib with RVR post op.  Now in NSR on IV Amio gtt  Inpatient Medications    Scheduled Meds: . bisacodyl  10 mg Rectal Once  . chlorhexidine  15 mL Mouth Rinse BID  . dextromethorphan-guaiFENesin  1 tablet Oral BID  . feeding supplement  1 Container Oral TID BM  . fluticasone  1 spray Each Nare Daily  . folic acid  1 mg Oral Daily  . lactulose  10 g Oral NOW  . mouth rinse  15 mL Mouth Rinse q12n4p  . multivitamin with minerals  1 tablet Oral Daily  . nicotine  21 mg Transdermal Daily  . oxybutynin  5 mg Oral BID  . sertraline  100 mg Oral Daily  . tamsulosin  0.4 mg Oral Daily  . thiamine  100 mg Oral Daily  . topiramate  100 mg Oral BID  . traMADol  50 mg Oral Q6H   Continuous Infusions: . amiodarone 30 mg/hr (08/09/17 0800)  . small volume/piggyback builder 0 mL/hr at 08/08/17 0700  . cefTRIAXone (ROCEPHIN)  IV Stopped (08/09/17 0300)  . diltiazem (CARDIZEM) infusion Stopped (08/08/17 2044)  . lactated ringers 100 mL/hr at 08/09/17 0800   PRN Meds: acetaminophen **OR** acetaminophen, bisacodyl, guaiFENesin-dextromethorphan, ipratropium-albuterol, ondansetron **OR** ondansetron (ZOFRAN) IV, oxyCODONE   Vital Signs    Vitals:   08/09/17 0500 08/09/17 0600 08/09/17 0700 08/09/17 0800  BP: 111/63 102/64 97/71 (!) 89/63  Pulse: 62 65 64 (!) 57  Resp: 15 (!) 21 (!) 21 11  Temp:   (!) 96 F (35.6 C)   TempSrc:   Axillary   SpO2: 95% 97% 96% 95%  Weight:      Height:        Intake/Output Summary (Last 24 hours) at 08/09/2017 0914 Last data filed at 08/09/2017 0800 Gross per 24 hour  Intake 2925.41 ml  Output 2130 ml  Net 795.41 ml   Filed Weights   08/05/17 2011 08/06/17 0511 08/09/17 0400  Weight: 152 lb 1.9 oz (69 kg) 152 lb 1.9 oz (69  kg) 167 lb 8.8 oz (76 kg)    Telemetry    NSR - Personally Reviewed  ECG    No new EKG to review - Personally Reviewed  Physical Exam   GEN: No acute distress.   Neck: No JVD Cardiac: RRR, no murmurs, rubs, or gallops.  Respiratory: Clear to auscultation bilaterally. GI: Soft, nontender, non-distended  MS: No edema; No deformity. Neuro:  Nonfocal  Psych: Normal affect   Labs    Chemistry Recent Labs  Lab 08/04/17 2312  08/07/17 0342 08/08/17 0501 08/09/17 0550  NA 125*   < > 127* 128* 130*  K 3.1*   < > 3.8 3.8 3.7  CL 87*   < > 93* 95* 98  CO2 21*   < > 17* 17* 21*  GLUCOSE 117*   < > 92 90 100*  BUN 54*   < > 71* 74* 62*  CREATININE 2.16*   < > 2.06* 2.06* 1.63*  CALCIUM 8.7*   < > 8.5* 8.7* 8.2*  PROT 7.3  --  6.4* 5.7*  --   ALBUMIN 2.9*  --  2.4* 2.4*  --   AST 42*  --  72* 65*  --   ALT 20  --  33 37  --   ALKPHOS 262*  --  258* 271*  --   BILITOT 1.6*  --  1.3* 1.2  --   GFRNONAA 30*   < > 32* 32* 42*  GFRAA 35*   < > 37* 37* 49*  ANIONGAP 17*   < > 17* 16* 11   < > = values in this interval not displayed.     Hematology Recent Labs  Lab 08/04/17 2312 08/07/17 0342 08/09/17 0550  WBC 13.8* 12.1* 8.0  RBC 4.38 4.01* 3.70*  HGB 12.6* 11.2* 10.6*  HCT 37.0* 34.8* 32.5*  MCV 84.5 86.8 87.8  MCH 28.8 27.9 28.6  MCHC 34.1 32.2 32.6  RDW 14.2 14.4 15.9*  PLT 236 168 122*    Cardiac Enzymes Recent Labs  Lab 08/04/17 2312  TROPONINI <0.03   No results for input(s): TROPIPOC in the last 168 hours.   BNPNo results for input(s): BNP, PROBNP in the last 168 hours.   DDimer No results for input(s): DDIMER in the last 168 hours.   Radiology    Dg Chest Port 1 View  Result Date: 08/09/2017 CLINICAL DATA:  Status post pericardial window creation for pericardial effusion. History of prostate carcinoma EXAM: PORTABLE CHEST 1 VIEW COMPARISON:  August 08, 2017 FINDINGS: Pericardial drainage catheter position unchanged. Central catheter tip is in the  superior vena cava. There is no appreciable pneumothorax. There are pleural effusions bilaterally with bibasilar consolidation, stable. There is stable cardiomegaly with pulmonary venous hypertension. No adenopathy. No evident bone lesions. There is aortic atherosclerosis. IMPRESSION: Tube and catheter positions as described. No evident pneumothorax. There is underlying pulmonary vascular congestion. There are pleural effusions bilaterally which appear to be layering. There is consolidation in both lung bases. No new opacity. There is aortic atherosclerosis. Aortic Atherosclerosis (ICD10-I70.0). Electronically Signed   By: Lowella Grip III M.D.   On: 08/09/2017 07:04   Dg Chest Port 1 View  Result Date: 08/08/2017 CLINICAL DATA:  Pericardial effusion. EXAM: PORTABLE CHEST 1 VIEW COMPARISON:  Radiographs of August 05, 2017. FINDINGS: Stable cardiomegaly. No pneumothorax is noted. Right internal jugular catheter is noted with tip in expected position of the SVC. Mild bibasilar opacities are noted concerning for atelectasis and possible associated pleural effusions. Catheter is seen positioned over the cardiac apex consistent with pericardial drainage catheter. Bony thorax is unremarkable. IMPRESSION: Mild bibasilar opacities are noted concerning for subsegmental listhesis with associated pleural effusions. Interval placement of probable pericardial drainage catheter seen over cardiac apex. Right internal jugular catheter is noted with distal tip in expected position of the SVC. Electronically Signed   By: Marijo Conception, M.D.   On: 08/08/2017 19:29    Cardiac Studies   2D echo 08/08/2017 Study Conclusions  - Left ventricle: The cavity size was normal. Systolic function was   normal. The estimated ejection fraction was in the range of 55%   to 60%. Wall motion was normal; there were no regional wall   motion abnormalities. - Inferior vena cava: The vessel was dilated. The respirophasic   diameter  changes were blunted (< 50%), consistent with elevated   central venous pressure. - Pericardium, extracardiac: A large, free-flowing pericardial   effusion was identified circumferential to the heart. The fluid   had no internal echoes. Multiple small mobile echodensities were   attached to the epicardial surface. There was mildright atrial   chamber collapse for less than 50% of the cardiac cycle.   Respirophasic change in stroke volume  was excessive. Features   were consistent with tamponade physiology.  Urgent and Critical Findings:   A critical finding, cardiac tamponade, was reported to Dr. Wyline Copas , an attending physician responsible for the patient , by Dr.Croitoru , on 08/08/2017 , at 11:51 AM. Impressions:  - compared to the previous study on 08/05/2017, there are more   convincing findings for pericardial tamponade.  Patient Profile     67 y.o. male who was found to have AKI, multiple nodules on CT concerning for metastatic disease of unknown primary source, and large pericardial effusion  Assessment & Plan    1. Pericardial effusion - in the setting of probable diffuse metastatic disease on CT - repeat echo yesterday with large pericardial effusion with tamponade physiology. - s/p subxyphoid pericardial window with >500cc bloody fluid  - suspect malignant effusion - cytology pending   - hemodynamics improved after drainage but BP soft this am  - repeat limited echo today  2. Pleural effusion: underwent thoracentesis on 08/05/2017 with removal of 360 ml of yellow colored fluid.   3. Lung nodule: newly diagnosed, likely metastatic cancer of unknown primary source.   4. AKI -creatinine improved today to 1.63 (peaked at 2.16) -continue to follow  5. Hyponatremia -sodium improved and now 130 (was 127)  6.  HTN -BP remains soft  -Clonidine and amlodipine on hold  7.  PAF -had episode of PAF with RVR yesterday -now on IV  Amio gtt and in NSR -not a candidate for anticoagulation due to bloody effusion   For questions or updates, please contact [place] Please consult [email or web address] for contact info under Cardiology/STEMI.      Signed, Fransico Him, MD  08/09/2017, 9:14 AM

## 2017-08-09 NOTE — Progress Notes (Signed)
Patient ID: [name] [name], male   DOB: [birth year], 67 y.o.   MRN: [id number] EVENING ROUNDS NOTE :     [address]       [place] [address]             [phone]                 1 Day Post-Op Procedure(s) (LRB): SUBXYPHOID PERICARDIAL WINDOW (N/A) TRANSESOPHAGEAL ECHOCARDIOGRAM (TEE)  Total Length of Stay:  LOS: 4 days  BP 103/71   Pulse 61   Temp (!) 96.5 F (35.8 C) (Oral)   Resp 13   Ht 5\' 8"  (1.727 m)   Wt 167 lb 8.8 oz (76 kg)   SpO2 97%   BMI 25.48 kg/m   .Intake/Output      07/23 0701 - 07/24 0700   P.O.    I.V. (mL/kg) 774.8 (10.2)   Other    IV Piggyback 99.8   Total Intake(mL/kg) 874.7 (11.5)   Urine (mL/kg/hr) 315 (0.3)   Other    Blood    Chest Tube 230   Total Output 545   Net +329.7         . amiodarone 30 mg/hr (08/09/17 1845)  . small volume/piggyback builder 0 mL/hr at 08/08/17 0700  . cefTRIAXone (ROCEPHIN)  IV Stopped (08/09/17 0300)  . dextrose 5 % and 0.9 % NaCl with KCl 20 mEq/L 50 mL/hr at 08/09/17 1800  . levETIRAcetam Stopped (08/09/17 1306)     Lab Results  Component Value Date   WBC 8.0 08/09/2017   HGB 10.6 (L) 08/09/2017   HCT 32.5 (L) 08/09/2017   PLT 122 (L) 08/09/2017   GLUCOSE 100 (H) 08/09/2017   ALT 37 08/08/2017   AST 65 (H) 08/08/2017   NA 130 (L) 08/09/2017   K 3.7 08/09/2017   CL 98 08/09/2017   CREATININE 1.63 (H) 08/09/2017   BUN 62 (H) 08/09/2017   CO2 21 (L) 08/09/2017   TSH 2.510 08/04/2017   PSA <0.1 08/28/2013   INR 1.26 08/08/2017    Window done last pm Stable Seizures today , attempted mri today    Grace Isaac MD  Beeper [phone] Office [phone] 08/09/2017 7:03 PM

## 2017-08-09 NOTE — Progress Notes (Addendum)
TCTS DAILY ICU PROGRESS NOTE                   [address]            [place] [address]          [phone]   1 Day Post-Op Procedure(s) (LRB): SUBXYPHOID PERICARDIAL WINDOW (N/A) TRANSESOPHAGEAL ECHOCARDIOGRAM (TEE)  Total Length of Stay:  LOS: 4 days   Subjective: Depressed mood this morning. States that his pain isn't well controlled.   Objective: Vital signs in last 24 hours: Temp:  [96 F (35.6 C)-97.8 F (36.6 C)] 96 F (35.6 C) (07/23 0700) Pulse Rate:  [57-132] 57 (07/23 0800) Cardiac Rhythm: Normal sinus rhythm (07/23 0800) Resp:  [11-26] 11 (07/23 0800) BP: (89-119)/(59-100) 89/63 (07/23 0800) SpO2:  [89 %-98 %] 95 % (07/23 0800) Arterial Line BP: (90-146)/(40-59) 90/40 (07/23 0800) Weight:  [76 kg (167 lb 8.8 oz)] 76 kg (167 lb 8.8 oz) (07/23 0400)  Filed Weights   08/05/17 2011 08/06/17 0511 08/09/17 0400  Weight: 69 kg (152 lb 1.9 oz) 69 kg (152 lb 1.9 oz) 76 kg (167 lb 8.8 oz)    Weight change:    Hemodynamic parameters for last 24 hours:    Intake/Output from previous day: 07/22 0701 - 07/23 0700 In: 2994.4 [P.O.:180; I.V.:2614.5; IV Piggyback:99.8] Out: 2230 [Urine:1020; Blood:40; Chest Tube:490]  Intake/Output this shift: Total I/O In: 111 [I.V.:111] Out: 100 [Urine:40; Chest Tube:60]  Current Meds: Scheduled Meds: . bisacodyl  10 mg Rectal Once  . chlorhexidine  15 mL Mouth Rinse BID  . dextromethorphan-guaiFENesin  1 tablet Oral BID  . feeding supplement  1 Container Oral TID BM  . fluticasone  1 spray Each Nare Daily  . folic acid  1 mg Oral Daily  . lactulose  10 g Oral NOW  . mouth rinse  15 mL Mouth Rinse q12n4p  . multivitamin with minerals  1 tablet Oral Daily  . nicotine  21 mg Transdermal Daily  . oxybutynin  5 mg Oral BID  . sertraline  100 mg Oral Daily  . tamsulosin  0.4 mg Oral Daily  . thiamine  100 mg Oral Daily  . topiramate  100 mg Oral BID  . traMADol  50 mg Oral Q6H   Continuous  Infusions: . amiodarone 30 mg/hr (08/09/17 0800)  . small volume/piggyback builder 0 mL/hr at 08/08/17 0700  . cefTRIAXone (ROCEPHIN)  IV Stopped (08/09/17 0300)  . diltiazem (CARDIZEM) infusion Stopped (08/08/17 2044)  . lactated ringers 100 mL/hr at 08/09/17 0800   PRN Meds:.acetaminophen **OR** acetaminophen, bisacodyl, guaiFENesin-dextromethorphan, ipratropium-albuterol, ondansetron **OR** ondansetron (ZOFRAN) IV, oxyCODONE  General appearance: cooperative and no distress Heart: regular rate and rhythm, S1, S2 normal, no murmur, click, rub or gallop and at times sinus bradycardia Lungs: clear to auscultation bilaterally Abdomen: soft, non-tender; bowel sounds normal; no masses,  no organomegaly Extremities: extremities normal, atraumatic, no cyanosis or edema Wound: clean and dry dressed with sterile dressing  Lab Results: CBC: Recent Labs    08/07/17 0342 08/09/17 0550  WBC 12.1* 8.0  HGB 11.2* 10.6*  HCT 34.8* 32.5*  PLT 168 122*   BMET:  Recent Labs    08/08/17 0501 08/09/17 0550  NA 128* 130*  K 3.8 3.7  CL 95* 98  CO2 17* 21*  GLUCOSE 90 100*  BUN 74* 62*  CREATININE 2.06* 1.63*  CALCIUM 8.7* 8.2*    CMET: Lab Results  Component Value Date   WBC  8.0 08/09/2017   HGB 10.6 (L) 08/09/2017   HCT 32.5 (L) 08/09/2017   PLT 122 (L) 08/09/2017   GLUCOSE 100 (H) 08/09/2017   ALT 37 08/08/2017   AST 65 (H) 08/08/2017   NA 130 (L) 08/09/2017   K 3.7 08/09/2017   CL 98 08/09/2017   CREATININE 1.63 (H) 08/09/2017   BUN 62 (H) 08/09/2017   CO2 21 (L) 08/09/2017   TSH 2.510 08/04/2017   PSA <0.1 08/28/2013   INR 1.26 08/08/2017      PT/INR:  Recent Labs    08/08/17 1417  LABPROT 15.7*  INR 1.26   Radiology: Dg Chest Port 1 View  Result Date: 08/09/2017 CLINICAL DATA:  Status post pericardial window creation for pericardial effusion. History of prostate carcinoma EXAM: PORTABLE CHEST 1 VIEW COMPARISON:  August 08, 2017 FINDINGS: Pericardial drainage  catheter position unchanged. Central catheter tip is in the superior vena cava. There is no appreciable pneumothorax. There are pleural effusions bilaterally with bibasilar consolidation, stable. There is stable cardiomegaly with pulmonary venous hypertension. No adenopathy. No evident bone lesions. There is aortic atherosclerosis. IMPRESSION: Tube and catheter positions as described. No evident pneumothorax. There is underlying pulmonary vascular congestion. There are pleural effusions bilaterally which appear to be layering. There is consolidation in both lung bases. No new opacity. There is aortic atherosclerosis. Aortic Atherosclerosis (ICD10-I70.0). Electronically Signed   By: Lowella Grip III M.D.   On: 08/09/2017 07:04   Dg Chest Port 1 View  Result Date: 08/08/2017 CLINICAL DATA:  Pericardial effusion. EXAM: PORTABLE CHEST 1 VIEW COMPARISON:  Radiographs of August 05, 2017. FINDINGS: Stable cardiomegaly. No pneumothorax is noted. Right internal jugular catheter is noted with tip in expected position of the SVC. Mild bibasilar opacities are noted concerning for atelectasis and possible associated pleural effusions. Catheter is seen positioned over the cardiac apex consistent with pericardial drainage catheter. Bony thorax is unremarkable. IMPRESSION: Mild bibasilar opacities are noted concerning for subsegmental listhesis with associated pleural effusions. Interval placement of probable pericardial drainage catheter seen over cardiac apex. Right internal jugular catheter is noted with distal tip in expected position of the SVC. Electronically Signed   By: Marijo Conception, M.D.   On: 08/08/2017 19:29     Assessment/Plan: S/P Procedure(s) (LRB): SUBXYPHOID PERICARDIAL WINDOW (N/A) TRANSESOPHAGEAL ECHOCARDIOGRAM (TEE)  1. CV-s/p subxyphoid pericardial window for a large bloody pericardial effusion. Remains on Amio IV now NSR in the 60s. Variable BP. Off Diltiazem. Pericardial drain put out 490 cc  in drainage since surgery-keep. >500cc evacuated in the OR.  2. Pulm-tolerating 3L [place] with good oxygen saturation. Chest xray today showed No pneumothorax, bilateral pleural effusions, and aortic atherosclerosis.  3.  Renal- creatinine 1.63, AKI, trending down. Electrolytes okay. Replace as needed 4. H and H 10.6/32.5, stable 5. Platelets 122k, will trend 6. Endo-blood glucose well controlled  Plan: continue critical care per primary. Continue pericardial drain at this time. Work on pain control-somewhat limited due to hypotension. Cannot receive Toradol due to kidney function.    Elgie Collard 08/09/2017 8:43 AM    I have seen and examined the patient and agree with the assessment and plan as outlined.  Patient remains extremely weak with very flat affect.  Hurts "all over".  Drainage from chest tube now more serous and less bloody - likely some component of ascites.  Will leave tube in place until output decreases considerably.  D/C [place].  Okay to transfer to step down.  Await results of  cytology, but I suspect that the patient has metastatic disease with very poor prognosis.  Ultimately may need CODE STATUS addressed and referral Hospice team but will defer care management to Hospitalist and Cardiology teams.  I discussed matters at length with the patient's ex-wife yesterday evening.  No family has come in yet today.  Rexene Alberts, MD 08/09/2017 10:08 AM

## 2017-08-09 NOTE — Progress Notes (Signed)
Physical Therapy Re-evaluation  Patient Details Name: [name] [name] MRN: [id number] DOB: 1951-01-13 Today's Date: 08/09/2017    History of Present Illness [name] [name] is a 67 y.o. male with medical history significant for prostate cancer-not currently on treatment, prior history of alcohol abuse, hypertension, anxiety/depression, and essential tremors who presents to the emergency department with vague symptoms of sinus congestion as well as some weakness and disequilibrium.  He also states that he has lost appetite because he cannot have a bowel movement and cannot state when his last bowel movement was.  He is a poor historian, but seems to think that he cannot cough up phlegm and appears to think that he is congested and "clogged up.". Pt s/p pericardial window on 7/22.    PT Comments    Pt underwent a pericaridal window yesterday. Pt presenting with severe depressed spirits. Is not eating or engaging in conversation and now requires modAx2 for sit to stand and stand pivot back to bed. Per RN patient also having seizure like activity. Pt with noted eyes rolling back into head frequently throughout session but was shaking head yes/no appropriately. Acute PT to continue to follow.   Follow Up Recommendations  SNF;Supervision for mobility/OOB     Equipment Recommendations  Rolling walker with 5" wheels    Recommendations for Other Services       Precautions / Restrictions Precautions Precautions: Fall Restrictions Weight Bearing Restrictions: No    Mobility  Bed Mobility Overal bed mobility: Needs Assistance Bed Mobility: Sit to Supine       Sit to supine: Max assist;+2 for physical assistance   General bed mobility comments: pt shook head "yes" to returning to bed however did not initiate transfer. pt required maxA to lower trunk and LE elevation. pt with grimacing and moaning due to abdominal pain  Transfers Overall transfer level: Needs assistance Equipment used: 2  person hand held assist Transfers: Sit to/from Stand;Stand Pivot Transfers Sit to Stand: +2 physical assistance;Min assist Stand pivot transfers: Mod assist;+2 physical assistance       General transfer comment: modA to power up, pt able to hold weight on his legs, pt refused to step towards bed requiring modA to slide feet over to bed, pt did complete 2 stands however with minimal participation  Ambulation/Gait             General Gait Details: unable at this time   Stairs             Wheelchair Mobility    Modified Rankin (Stroke Patients Only)       Balance Overall balance assessment: Needs assistance Sitting-balance support: Feet supported;No upper extremity supported Sitting balance-Leahy Scale: Fair     Standing balance support: No upper extremity supported;During functional activity Standing balance-Leahy Scale: Poor Standing balance comment: dependent on physical assist                            Cognition Arousal/Alertness: Awake/alert Behavior During Therapy: Flat affect(extremely depressed) Overall Cognitive Status: Impaired/Different from baseline                                 General Comments: pt extremely depressed, doesn't engage in conversation, has no appetite. pt with minimal effort to participate in PT       Exercises      General Comments General comments (skin integrity, edema, etc.): p  Pertinent Vitals/Pain Pain Assessment: Faces Faces Pain Scale: Hurts whole lot Pain Location: abdomen Pain Descriptors / Indicators: (wouldn't comment) Pain Intervention(s): Patient requesting pain meds-RN notified    Home Living                      Prior Function            PT Goals (current goals can now be found in the care plan section) Acute Rehab PT Goals Patient Stated Goal: didn't state Progress towards PT goals: Progressing toward goals    Frequency    Min 3X/week      PT Plan  Current plan remains appropriate    Co-evaluation              AM-PAC PT "6 Clicks" Daily Activity  Outcome Measure  Difficulty turning over in bed (including adjusting bedclothes, sheets and blankets)?: Unable Difficulty moving from lying on back to sitting on the side of the bed? : Unable Difficulty sitting down on and standing up from a chair with arms (e.g., wheelchair, bedside commode, etc,.)?: Unable Help needed moving to and from a bed to chair (including a wheelchair)?: Total Help needed walking in hospital room?: Total Help needed climbing 3-5 steps with a railing? : Total 6 Click Score: 6    End of Session   Activity Tolerance: Patient tolerated treatment well;Patient limited by fatigue Patient left: in bed;with call bell/phone within reach;with nursing/sitter in room;with family/visitor present Nurse Communication: Mobility status PT Visit Diagnosis: Unsteadiness on feet (R26.81);Other abnormalities of gait and mobility (R26.89);Muscle weakness (generalized) (M62.81)     Time: 7510-2585 PT Time Calculation (min) (ACUTE ONLY): 21 min  Charges:                       G Codes:       Kittie Plater, PT, DPT Pager #: [phone] Office #: [phone]    [place] 08/09/2017, 2:51 PM

## 2017-08-10 ENCOUNTER — Inpatient Hospital Stay (HOSPITAL_COMMUNITY): Payer: Medicare HMO

## 2017-08-10 LAB — CBC
HCT: 34 % — ABNORMAL LOW (ref 39.0–52.0)
Hemoglobin: 10.8 g/dL — ABNORMAL LOW (ref 13.0–17.0)
MCH: 28.7 pg (ref 26.0–34.0)
MCHC: 31.8 g/dL (ref 30.0–36.0)
MCV: 90.4 fL (ref 78.0–100.0)
PLATELETS: 129 10*3/uL — AB (ref 150–400)
RBC: 3.76 MIL/uL — AB (ref 4.22–5.81)
RDW: 16.9 % — ABNORMAL HIGH (ref 11.5–15.5)
WBC: 9.4 10*3/uL (ref 4.0–10.5)

## 2017-08-10 LAB — COMPREHENSIVE METABOLIC PANEL
ALK PHOS: 210 U/L — AB (ref 38–126)
ALT: 22 U/L (ref 0–44)
AST: 32 U/L (ref 15–41)
Albumin: 2 g/dL — ABNORMAL LOW (ref 3.5–5.0)
Anion gap: 8 (ref 5–15)
BILIRUBIN TOTAL: 0.8 mg/dL (ref 0.3–1.2)
BUN: 52 mg/dL — ABNORMAL HIGH (ref 8–23)
CALCIUM: 8.2 mg/dL — AB (ref 8.9–10.3)
CO2: 24 mmol/L (ref 22–32)
CREATININE: 1.45 mg/dL — AB (ref 0.61–1.24)
Chloride: 100 mmol/L (ref 98–111)
GFR, EST AFRICAN AMERICAN: 56 mL/min — AB (ref >60)
GFR, EST NON AFRICAN AMERICAN: 49 mL/min — AB (ref >60)
Glucose, Bld: 135 mg/dL — ABNORMAL HIGH (ref 70–99)
Potassium: 4 mmol/L (ref 3.5–5.1)
Sodium: 132 mmol/L — ABNORMAL LOW (ref 135–145)
TOTAL PROTEIN: 5.1 g/dL — AB (ref 6.5–8.1)

## 2017-08-10 LAB — CULTURE, BODY FLUID-BOTTLE: CULTURE: NO GROWTH

## 2017-08-10 MED ORDER — GADOBENATE DIMEGLUMINE 529 MG/ML IV SOLN
16.0000 mL | Freq: Once | INTRAVENOUS | Status: AC | PRN
  Administered 2017-08-10: 16 mL via INTRAVENOUS

## 2017-08-10 NOTE — Progress Notes (Addendum)
TCTS DAILY ICU PROGRESS NOTE                   [address]            [place] [address]          [phone]   2 Days Post-Op Procedure(s) (LRB): SUBXYPHOID PERICARDIAL WINDOW (N/A) TRANSESOPHAGEAL ECHOCARDIOGRAM (TEE)  Total Length of Stay:  LOS: 5 days   Subjective: Still having a lot of pain per the patient but remains barely awake during my interview.   Objective: Vital signs in last 24 hours: Temp:  [96.5 F (35.8 C)-97.5 F (36.4 C)] 97.4 F (36.3 C) (07/24 0356) Pulse Rate:  [57-66] 59 (07/24 0600) Cardiac Rhythm: Normal sinus rhythm (07/23 2000) Resp:  [9-24] 19 (07/24 0600) BP: (89-110)/(60-71) 99/65 (07/24 0600) SpO2:  [94 %-99 %] 97 % (07/24 0600) Arterial Line BP: (90-94)/(39-40) 94/39 (07/23 0900)  Filed Weights   08/05/17 2011 08/06/17 0511 08/09/17 0400  Weight: 69 kg (152 lb 1.9 oz) 69 kg (152 lb 1.9 oz) 76 kg (167 lb 8.8 oz)    Weight change:       Intake/Output from previous day: 07/23 0701 - 07/24 0700 In: 1835.7 [I.V.:1535.8; IV Piggyback:299.8] Out: 1100 [Urine:810; Chest Tube:290]  Intake/Output this shift: No intake/output data recorded.  Current Meds: Scheduled Meds: . bisacodyl  10 mg Rectal Once  . chlorhexidine  15 mL Mouth Rinse BID  . dexamethasone  4 mg Oral Q6H  . dextromethorphan-guaiFENesin  1 tablet Oral BID  . feeding supplement  1 Container Oral TID BM  . fluticasone  1 spray Each Nare Daily  . folic acid  1 mg Oral Daily  . mouth rinse  15 mL Mouth Rinse q12n4p  . multivitamin with minerals  1 tablet Oral Daily  . nicotine  21 mg Transdermal Daily  . oxybutynin  5 mg Oral BID  . sertraline  100 mg Oral Daily  . tamsulosin  0.4 mg Oral Daily  . thiamine  100 mg Oral Daily  . topiramate  100 mg Oral BID  . traMADol  50 mg Oral Q6H   Continuous Infusions: . amiodarone 30 mg/hr (08/10/17 0600)  . small volume/piggyback builder 0 mL/hr at 08/08/17 0700  . cefTRIAXone (ROCEPHIN)  IV Stopped  (08/10/17 0320)  . dextrose 5 % and 0.9 % NaCl with KCl 20 mEq/L 50 mL/hr at 08/10/17 0600  . levETIRAcetam Stopped (08/10/17 0022)   PRN Meds:.acetaminophen **OR** acetaminophen, bisacodyl, guaiFENesin-dextromethorphan, ipratropium-albuterol, LORazepam, LORazepam, ondansetron **OR** ondansetron (ZOFRAN) IV, oxyCODONE  General appearance: cooperative and no distress Heart: regular rate and rhythm, S1, S2 normal, no murmur, click, rub or gallop Lungs: clear to auscultation bilaterally Abdomen: diminished bowel sounds, soft, nontender Extremities: extremities normal, atraumatic, no cyanosis or edema Wound: clean and dry covered with a sterile dressing  Lab Results: CBC: Recent Labs    08/09/17 0550 08/10/17 0500  WBC 8.0 9.4  HGB 10.6* 10.8*  HCT 32.5* 34.0*  PLT 122* 129*   BMET:  Recent Labs    08/09/17 0550 08/10/17 0500  NA 130* 132*  K 3.7 4.0  CL 98 100  CO2 21* 24  GLUCOSE 100* 135*  BUN 62* 52*  CREATININE 1.63* 1.45*  CALCIUM 8.2* 8.2*    CMET: Lab Results  Component Value Date   WBC 9.4 08/10/2017   HGB 10.8 (L) 08/10/2017   HCT 34.0 (L) 08/10/2017   PLT 129 (L) 08/10/2017   GLUCOSE 135 (H)  08/10/2017   ALT 22 08/10/2017   AST 32 08/10/2017   NA 132 (L) 08/10/2017   K 4.0 08/10/2017   CL 100 08/10/2017   CREATININE 1.45 (H) 08/10/2017   BUN 52 (H) 08/10/2017   CO2 24 08/10/2017   TSH 2.510 08/04/2017   PSA <0.1 08/28/2013   INR 1.26 08/08/2017      PT/INR:  Recent Labs    08/08/17 1417  LABPROT 15.7*  INR 1.26   Radiology: Mr Brain Wo Contrast  Result Date: 08/09/2017 CLINICAL DATA:  Metastatic cancer. EXAM: MRI HEAD WITHOUT CONTRAST TECHNIQUE: Multiplanar, multiecho pulse sequences of the brain and surrounding structures were obtained without intravenous contrast. COMPARISON:  CT head 08/04/2017 FINDINGS: Brain: The patient was not able to complete the study. Diffusion, T2, and FLAIR imaging of the brain were performed. Intravenous  contrast was not administered. Mild generalized atrophy. Negative for hydrocephalus. Negative for acute infarct. Mild chronic ischemic changes in the white matter. Vascular: Normal arterial flow voids Skull and upper cervical spine: No skull lesion identified. Sinuses/Orbits: Paranasal sinuses clear.  Normal orbit. Other: None IMPRESSION: No acute abnormality. Atrophy and mild chronic microvascular ischemia The patient was not able to complete the study and intravenous contrast was not administered. Electronically Signed   By: Franchot Gallo M.D.   On: 08/09/2017 15:33     Assessment/Plan: S/P Procedure(s) (LRB): SUBXYPHOID PERICARDIAL WINDOW (N/A) TRANSESOPHAGEAL ECHOCARDIOGRAM (TEE)  1. CV-s/p subxyphoid pericardial window for a large bloody pericardial effusion. Remains on Amio IV now NSR in the 60s. Variable BP but MAP stays in the 70s-80s. Pericardial drain put out 290 cc in 24 hours-keep. >500cc evacuated in the OR.  2. Pulm-tolerating 3L [place] with good oxygen saturation. Chest xray today showed No pneumothorax, bilateral pleural effusions, and aortic atherosclerosis.  3.  Renal- creatinine 1.45, AKI, trending down. Electrolytes okay. Replace as needed 4. H and H 10.8/34.0, stable 5. Platelets 129k, will trend 6. Endo-blood glucose well controlled 7. Neuro-seizures yesterday. Attempted MRI but patient couldn't complete the study, no contrast administered. Now on Keppra and Ativan  Plan: continue pericardial drain. Guarded prognosis, may need palliative care consult. Medical management per primary service.     Elgie Collard 08/10/2017 7:36 AM  I have seen and examined the patient and agree with the assessment and plan as outlined.  Drainage from pericardial tube decreasing but still significant.  Maintaining NSR w/ stable BP.  Would not recommend systemic anticoagulation for Afib due to risk of bleeding.  Cytology from pericardial fluid still pending.  Cytology from left pleural effusion  benign.  Non-contrast MRI brain unrevealing but contrast-enhanced scan aborted.  Plan per medical teams.  Rexene Alberts, MD 08/10/2017 8:08 AM

## 2017-08-10 NOTE — Progress Notes (Signed)
PROGRESS NOTE  [name] [name] UTM:[id number] DOB: January 03, 1951 DOA: 08/04/2017 PCP: [name], MD  HPI/Recap of past 24 hours:  Weak and frail, attempt to follow commands  Assessment/Plan: Principal Problem:   Pericardial tamponade Active Problems:   Prostate cancer ([place])   Essential hypertension   AKI (acute kidney injury) ([place])   Hyponatremia   Hypokalemia   Ileus (HCC)   Lactic acidosis   Community acquired pneumonia   Pulmonary nodule   Pneumonia   Pleural effusion   Pericardial effusion  Pericardial effusion -2D echo with findings of large pericardial effusion with borderline tamponade physiology. Clinically no signs of cardiac tamponade.  -Patient was transferred to [facility] from [facility] for closer monitoring -Cardiology is following. Repeat 2d limited echo performed 7/22 with concerns of increasing pericardial effusion with tamonade -CTS was consulted and patient underwent pericardial window on 7/22 -Labs reviewed, pleural fluid cytology remains pending this AM  Pulmonary nodules with bilateral pleural effusions and possible pneumonia -Patient is continued on empiric Rocephin and azithromycin. -presently on minimal O2 requirements as per above -patient is s/p thoracentesis yielding just over 300cc fluid,  -Labs reviewed. Cytology still pending this AM. If unequivocal, then consider IR bipsy.   Diffuse metastatic disease with unknown primary.  -Patient with bilateral pulmonary, retroperitoneal, mediastinal lymph nodes with hepatic lesions suggestive of diffuse metastatic disease of unknown primary. -Will likely require Oncology consultation, awaiting cytology results -Patient is currently continued on gentle IVF hydration.  -Would recommend formal metastatic survey when patient is more stable  Acute kidney injury -Suspect prerenal in etiology -Patient was continued on gentle IVF hydration.  -Labs reviewed. Renal function has now  improved -Recheck bmet in AM  Hyponatremia -Possible prerenal versus SIADH with pneumonia/malignancy.  -Improvement noted with hydration.  -Patient continued on IVF hydration  Elevated lactic acid -Clinically not septic -Most recent levels had improved, labs reviewed  Tobacco abuse -Counseled strongly on cessation. Done at bedside -Smokes 1/2 pack/day prior to admission -Patient to continue nicotine patch  Alcohol abuse -No evidence of withdrawals this morning -Pt has been continue on CIWA. -Stable this AM  Possible seizure, he is started on iv keppra from 7/23 Mri brain negative for mets eeg "mild posterior background slowing.  This finding may be seen with a diffuse gray matter disturbance that is etiologically nonspecific, but may include a dementia, among other possibilities.  No epileptiform activity is noted. "    Anxiety and depression -appears stable at present -Patient reports not tolerating klonopin prior to admit and had not been taking -flat affect noted, suspect element of mild depression this AM  PAF -Patient noted to have afib RVR overnight following pericardial window -Patient has been continued on amiodarone gtt -Now in NSR -Cardiology following  Poor prognosis, pathology pending, thoracic surgery recommended palliative care consult  DVT prophylaxis: heparin subq Code Status: Full Family Communication: Pt in room, family not at bedside Disposition Plan: Uncertain at this time  Consultants:   Cardiology  IR  CTS  Palliative care  Pulmonary/critical care  Procedures:   Thoracentesis 7/19 Subxyphoid Pericardial Window 7/22    Antibiotics:  As above   Objective: BP 99/68   Pulse (!) 57   Temp (!) 94.2 F (34.6 C) (Axillary)   Resp 11   Ht 5\' 8"  (1.727 m)   Wt 76 kg (167 lb 8.8 oz)   SpO2 93%   BMI 25.48 kg/m   Intake/Output Summary (Last 24 hours) at 08/10/2017 5681 Last data filed at  08/10/2017 0600 Gross per  24 hour  Intake 1724.62 ml  Output 1000 ml  Net 724.62 ml   Filed Weights   08/05/17 2011 08/06/17 0511 08/09/17 0400  Weight: 69 kg (152 lb 1.9 oz) 69 kg (152 lb 1.9 oz) 76 kg (167 lb 8.8 oz)    Exam: Patient is examined daily including today on 08/10/2017, exams remain the same as of yesterday except that has changed    General:  Weak and frail, thin  Cardiovascular: RRR, subxyphoid pericardial window with drain  Respiratory: poor respiration effort, no wheezing, no rales,   Abdomen: Soft/ND/NT, positive BS  Musculoskeletal: No Edema  Neuro: lethargic, oriented to person and place , not to time  Data Reviewed: Basic Metabolic Panel: Recent Labs  Lab 08/04/17 2312  08/06/17 0836 08/07/17 0342 08/08/17 0501 08/09/17 0550 08/10/17 0500  NA 125*   < > 127* 127* 128* 130* 132*  K 3.1*   < > 3.8 3.8 3.8 3.7 4.0  CL 87*   < > 92* 93* 95* 98 100  CO2 21*   < > 18* 17* 17* 21* 24  GLUCOSE 117*   < > 98 92 90 100* 135*  BUN 54*   < > 66* 71* 74* 62* 52*  CREATININE 2.16*   < > 2.10* 2.06* 2.06* 1.63* 1.45*  CALCIUM 8.7*   < > 8.5* 8.5* 8.7* 8.2* 8.2*  MG 2.5*  --   --   --   --  2.5*  --    < > = values in this interval not displayed.   Liver Function Tests: Recent Labs  Lab 08/04/17 2312 08/07/17 0342 08/08/17 0501 08/10/17 0500  AST 42* 72* 65* 32  ALT 20 33 37 22  ALKPHOS 262* 258* 271* 210*  BILITOT 1.6* 1.3* 1.2 0.8  PROT 7.3 6.4* 5.7* 5.1*  ALBUMIN 2.9* 2.4* 2.4* 2.0*   No results for input(s): LIPASE, AMYLASE in the last 168 hours. Recent Labs  Lab 08/04/17 2312  AMMONIA 10   CBC: Recent Labs  Lab 08/04/17 2312 08/07/17 0342 08/09/17 0550 08/10/17 0500  WBC 13.8* 12.1* 8.0 9.4  HGB 12.6* 11.2* 10.6* 10.8*  HCT 37.0* 34.8* 32.5* 34.0*  MCV 84.5 86.8 87.8 90.4  PLT 236 168 122* 129*   Cardiac Enzymes:   Recent Labs  Lab 08/04/17 2312  TROPONINI <0.03   BNP (last 3 results) No results for input(s): BNP in the last 8760 hours.  ProBNP  (last 3 results) No results for input(s): PROBNP in the last 8760 hours.  CBG: Recent Labs  Lab 08/04/17 2318  GLUCAP 120*    Recent Results (from the past 240 hour(s))  Culture, body fluid-bottle     Status: None   Collection Time: 08/05/17  1:31 PM  Result Value Ref Range Status   Specimen Description ASCITIC  Final   Special Requests BOTTLES DRAWN AEROBIC AND ANAEROBIC 10 CC EACH  Final   Culture   Final    NO GROWTH 5 DAYS Performed at [facility], [address], [place], [place] [address]    Report Status 08/10/2017 FINAL  Final  Gram stain     Status: None   Collection Time: 08/05/17  1:31 PM  Result Value Ref Range Status   Specimen Description PLEURAL  Final   Special Requests NONE  Final   Gram Stain   Final    CYTOSPIN SMEAR NO ORGANISMS SEEN WBC PRESENT, PREDOMINANTLY MONONUCLEAR Performed at [facility], [place]  [address] [place], [place] [address]    Report Status 08/05/2017 FINAL  Final  Acid Fast Smear (AFB)     Status: None   Collection Time: 08/05/17  1:31 PM  Result Value Ref Range Status   AFB Specimen Processing Concentration  Final   Acid Fast Smear Negative  Final    Comment: (NOTE) Performed At: [facility] [place], [place] [id number] Rush Farmer MD IP:[id number]    Source (AFB) PLEURAL  Final    Comment: Performed at [facility], [address], [place], [place] [address]  Body fluid culture     Status: None (Preliminary result)   Collection Time: 08/08/17  5:29 PM  Result Value Ref Range Status   Specimen Description FLUID PERICARDIAL  Final   Special Requests NONE  Final   Gram Stain   Final    ABUNDANT WBC PRESENT,BOTH PMN AND MONONUCLEAR NO ORGANISMS SEEN    Culture   Final    NO GROWTH 2 DAYS Performed at [facility], [address] [place], [place] [address]    Report Status PENDING  Incomplete  Acid Fast Smear (AFB)     Status: None   Collection Time: 08/08/17  5:29 PM  Result Value Ref Range  Status   AFB Specimen Processing Concentration  Final   Acid Fast Smear Negative  Final    Comment: (NOTE) Performed At: [facility] [place], [place] [id number] Rush Farmer MD [id number]    Source (AFB) FLUID  Final    Comment: PERICARDIAL Performed at [facility], [place] [address], [place], [place] [address]   MRSA PCR Screening     Status: None   Collection Time: 08/09/17  1:09 AM  Result Value Ref Range Status   MRSA by PCR NEGATIVE NEGATIVE Final    Comment:        The GeneXpert MRSA Assay (FDA approved for NASAL specimens only), is one component of a comprehensive MRSA colonization surveillance program. It is not intended to diagnose MRSA infection nor to guide or monitor treatment for MRSA infections. Performed at [facility], [place] [address], [place], [place] [address]      Studies: Mr Brain Wo Contrast  Result Date: 08/09/2017 CLINICAL DATA:  Metastatic cancer. EXAM: MRI HEAD WITHOUT CONTRAST TECHNIQUE: Multiplanar, multiecho pulse sequences of the brain and surrounding structures were obtained without intravenous contrast. COMPARISON:  CT head 08/04/2017 FINDINGS: Brain: The patient was not able to complete the study. Diffusion, T2, and FLAIR imaging of the brain were performed. Intravenous contrast was not administered. Mild generalized atrophy. Negative for hydrocephalus. Negative for acute infarct. Mild chronic ischemic changes in the white matter. Vascular: Normal arterial flow voids Skull and upper cervical spine: No skull lesion identified. Sinuses/Orbits: Paranasal sinuses clear.  Normal orbit. Other: None IMPRESSION: No acute abnormality. Atrophy and mild chronic microvascular ischemia The patient was not able to complete the study and intravenous contrast was not administered. Electronically Signed   By: Franchot Gallo M.D.   On: 08/09/2017 15:33    Scheduled Meds: . bisacodyl  10 mg Rectal Once  . chlorhexidine  15 mL  Mouth Rinse BID  . dexamethasone  4 mg Oral Q6H  . dextromethorphan-guaiFENesin  1 tablet Oral BID  . feeding supplement  1 Container Oral TID BM  . fluticasone  1 spray Each Nare Daily  . folic acid  1 mg Oral Daily  . mouth rinse  15 mL Mouth Rinse  q12n4p  . multivitamin with minerals  1 tablet Oral Daily  . nicotine  21 mg Transdermal Daily  . oxybutynin  5 mg Oral BID  . sertraline  100 mg Oral Daily  . tamsulosin  0.4 mg Oral Daily  . thiamine  100 mg Oral Daily  . topiramate  100 mg Oral BID  . traMADol  50 mg Oral Q6H    Continuous Infusions: . amiodarone 30 mg/hr (08/10/17 0600)  . small volume/piggyback builder 0 mL/hr at 08/08/17 0700  . cefTRIAXone (ROCEPHIN)  IV Stopped (08/10/17 0320)  . dextrose 5 % and 0.9 % NaCl with KCl 20 mEq/L 50 mL/hr at 08/10/17 0600  . levETIRAcetam Stopped (08/10/17 0022)     Time spent: 53mins I have personally reviewed and interpreted on  08/10/2017 daily labs, tele strips, imagings as discussed above under date review session and assessment and plans.  I reviewed all nursing notes, pharmacy notes, consultant notes,  vitals, pertinent old records  I have discussed plan of care as described above with RN , patient  on 08/10/2017   Florencia Reasons MD, PhD  [facility] Pager [phone]. If 7PM-7AM, please contact night-coverage at [email or web address], password [facility] 08/10/2017, 8:39 AM  LOS: 5 days

## 2017-08-10 NOTE — Progress Notes (Signed)
Pt refused all PO meds. Encouraged to take meds and educated. Pt still refused, states he only would take IV meds, no PO meds. MD Debbora Dus notified. Pt taken to MRI at this time. Family at bedside.

## 2017-08-10 NOTE — Consult Note (Signed)
Consultation Note Date: 08/10/2017   Patient Name: [name] [name]  DOB: [birth year]  MRN: [id number]  Age / Sex: 67 y.o., male  PCP: [name], MD Referring Physician: Florencia Reasons, MD  Reason for Consultation: Establishing goals of care  HPI/Patient Profile: 67 y.o. male  admitted on 08/04/2017    Clinical Assessment and Goals of Care:  67 year old gentleman lives in [place], [place] with ex wife's nephew. Has past medical history of prostate cancer and alcohol use history of hypertension history of anxiety depression history of essential tremors.  Patient was initially admitted at [facility] according to ex-wife present at the bedside, he was diagnosed with pneumonia and then underwent thoracenteses for pleural effusion. She states that after that he was transferred to [facility].   Patient reportedly has had a weight loss of at least 100 pounds in the past several months at least since April 2019.  Patient was diagnosed with large circumferential pericardial effusion and underwent urgent pericardial drainage and window procedure. Also found to have bilateral effusions. Imaging with CT scan of chest showed bilateral pulmonary nodules, mediastinal and retroperitoneal adenopathy, hepatic lesions, omental and peritoneal implants, bilateral pleural effusions and ascites. Most concerning for metastatic disease of unknown primary.  Pleural fluid cytology showed reactive cells. Patient also had seizures in this hospitalization. There is reportedly no history of seizures in the past. Patient underwent EEG, also to undergo MRI of the brain. This is a repeat attempt at getting an MRI of the brain as the patient could not complete the prior study and no contrast was given.  Periodically, patient has been refusing medications, has had ongoing generalized weakness and decline. Palliative consult for goals of care  discussions.  Patient is awake sitting up in bed. He appears weak. At times he says a few words and participates in discussions, however, at times, he rolls his eyes shrugs his shoulders and does not respond or interact much. Ex-wife and daughter present at the bedside.  Ex-wife [name] [name] states that they were married for 42 years. They've been divorced for the past couple of years. Patient has 3 daughters. He is estranged from one of them. One daughter is present at the bedside. He also has a third daughter who lives locally and is somewhat involved in his healthcare decisions.  Wife and daughter discussed about pertinent issues related to this hospitalization. There are appreciative of the information they have received from hospital staff.  I introduced myself and palliative care as follows: Palliative medicine is specialized medical care for people living with serious illness. It focuses on providing relief from the symptoms and stress of a serious illness. The goal is to improve quality of life for both the patient and the family.  We discussed about high likelihood that the patient has a widely metastatic malignancy. We discussed about his very poor functional status. Goals wishes and values discussed. I repeatedly attempted to engage the patient directly especially with regards to [place] discussions. Patient simply said "tube" in response, he would not  clarify whether or not he wanted the full scope of resuscitative attempt. Discussed extensively about appropriate ness of establishing DO NOT RESUSCITATE/DO NOT INTUBATE with patient and family. Patient grew frustrated and told his ex-wife, "I'm trying, [name] [name]."  I discussed briefly with daughter again at the bedside. She became tearful. She states that she fully recognizes that the patient has a high chance of not doing well, of ongoing decline. See additional discussions/recommendations below. Thank you for the consult. We will need to continue  ongoing discussions for appropriate goals of care planning.  NEXT OF KIN  Ex wife and has 3 daughters, one of whom is estranged.  Ex wife and daughter at bedside.   SUMMARY OF RECOMMENDATIONS    remains full code/full scope for now. While patient and family understand the serious nature of his illness, the high likelihood of this being a widely metastatic malignancy, their goals are not comfort-focused-only, at this point.  PMT to continue to engage patient and family in discussions, based on his hospital course.  Thank you for the consult.   Code Status/Advance Care Planning:  Full code    Symptom Management:    continue current mode of care.   Palliative Prophylaxis:   Delirium Protocol  Additional Recommendations (Limitations, Scope, Preferences):  Full Scope Treatment  Psycho-social/Spiritual:   Desire for further Chaplaincy support:yes  Additional Recommendations: Caregiving  Support/Resources  Prognosis:   Unable to determine  Discharge Planning: To Be Determined      Primary Diagnoses: Present on Admission: . AKI (acute kidney injury) ([place]) . Pneumonia . Pericardial effusion . Pericardial tamponade   I have reviewed the medical record, interviewed the patient and family, and examined the patient. The following aspects are pertinent.  Past Medical History:  Diagnosis Date  . Cancer [facility])    Prostate cancer  . Hypertension   . Pericardial effusion 08/05/2017   Social History   Socioeconomic History  . Marital status: Married    Spouse name: Not on file  . Number of children: Not on file  . Years of education: Not on file  . Highest education level: Not on file  Occupational History  . Not on file  Social Needs  . Financial resource strain: Not on file  . Food insecurity:    Worry: Not on file    Inability: Not on file  . Transportation needs:    Medical: Not on file    Non-medical: Not on file  Tobacco Use  . Smoking status: Current  Every Day Smoker    Packs/day: 1.00  . Smokeless tobacco: Never Used  Substance and Sexual Activity  . Alcohol use: Not Currently  . Drug use: Never  . Sexual activity: Not on file  Lifestyle  . Physical activity:    Days per week: Not on file    Minutes per session: Not on file  . Stress: Not on file  Relationships  . Social connections:    Talks on phone: Not on file    Gets together: Not on file    Attends religious service: Not on file    Active member of club or organization: Not on file    Attends meetings of clubs or organizations: Not on file    Relationship status: Not on file  Other Topics Concern  . Not on file  Social History Narrative  . Not on file   History reviewed. No pertinent family history. Scheduled Meds: . bisacodyl  10 mg Rectal Once  . chlorhexidine  15 mL Mouth Rinse BID  . dexamethasone  4 mg Oral Q6H  . dextromethorphan-guaiFENesin  1 tablet Oral BID  . feeding supplement  1 Container Oral TID BM  . fluticasone  1 spray Each Nare Daily  . folic acid  1 mg Oral Daily  . mouth rinse  15 mL Mouth Rinse q12n4p  . multivitamin with minerals  1 tablet Oral Daily  . nicotine  21 mg Transdermal Daily  . oxybutynin  5 mg Oral BID  . sertraline  100 mg Oral Daily  . tamsulosin  0.4 mg Oral Daily  . thiamine  100 mg Oral Daily  . topiramate  100 mg Oral BID  . traMADol  50 mg Oral Q6H   Continuous Infusions: . amiodarone 30 mg/hr (08/10/17 0900)  . small volume/piggyback builder 0 mL/hr at 08/08/17 0700  . cefTRIAXone (ROCEPHIN)  IV Stopped (08/10/17 0320)  . dextrose 5 % and 0.9 % NaCl with KCl 20 mEq/L 50 mL/hr at 08/10/17 0900  . levETIRAcetam Stopped (08/10/17 0022)   PRN Meds:.acetaminophen **OR** acetaminophen, bisacodyl, guaiFENesin-dextromethorphan, ipratropium-albuterol, LORazepam, ondansetron **OR** ondansetron (ZOFRAN) IV, oxyCODONE Medications Prior to Admission:  Prior to Admission medications   Medication Sig Start Date End Date  Taking? Authorizing Provider  amLODipine (NORVASC) 5 MG tablet Take 5 mg by mouth daily. 07/20/17  Yes [provider]  clonazePAM (KLONOPIN) 1 MG tablet Take 1 mg by mouth 3 (three) times daily. 05/09/17  Yes [provider]  cloNIDine (CATAPRES) 0.1 MG tablet Take 0.1 mg by mouth 2 (two) times daily. 07/27/17  Yes [provider]  gabapentin (NEURONTIN) 300 MG capsule Take 300 mg by mouth 2 (two) times daily. 06/20/17  Yes [provider]  oxybutynin (DITROPAN) 5 MG tablet Take 5 mg by mouth 2 (two) times daily.  01/21/17  Yes [provider]  sertraline (ZOLOFT) 100 MG tablet Take 100 mg by mouth daily. 06/15/17  Yes [provider]  tamsulosin (FLOMAX) 0.4 MG CAPS capsule Take 0.4 mg by mouth daily.  07/27/16  Yes [provider]  topiramate (TOPAMAX) 100 MG tablet Take 100 mg by mouth 2 (two) times daily. 06/27/17  Yes [provider]   Allergies  Allergen Reactions  . Bee Venom Anaphylaxis   Review of Systems +weakness  Physical Exam Cachectic appearing gentleman Appears older than stated age Patient is awake and alert at times, at times he just rolls his eyes strokes and doesn't respond appropriately Diminished breath sounds S1-S2 Abdomen soft No edema thin cachectic Flat affect  Vital Signs: BP (!) 84/63   Pulse 60   Temp (!) 94.2 F (34.6 C) (Axillary)   Resp 14   Ht 5\' 8"  (1.727 m)   Wt 76 kg (167 lb 8.8 oz)   SpO2 96%   BMI 25.48 kg/m  Pain Scale: 0-10   Pain Score: 2    SpO2: SpO2: 96 % O2 Device:SpO2: 96 % O2 Flow Rate: .O2 Flow Rate (L/min): 3 L/min  IO: Intake/output summary:   Intake/Output Summary (Last 24 hours) at 08/10/2017 1118 Last data filed at 08/10/2017 0900 Gross per 24 hour  Intake 1531.41 ml  Output 805 ml  Net 726.41 ml    LBM: Last BM Date: (unknown) Baseline Weight: Weight: 68 kg (150 lb) Most recent weight: Weight: 76 kg (167 lb 8.8 oz)     Palliative  Assessment/Data:   PPS 30%  Time In:  10 Time Out:  11.10 Time Total:  70 min  Greater than 50%  of this time was spent counseling and coordinating care related to the above assessment and plan.  Signed by: Loistine Chance, MD [phone]   Please contact Palliative Medicine Team phone at [phone] for questions and concerns.  For individual provider: See Shea Evans

## 2017-08-10 NOTE — Progress Notes (Signed)
Progress Note  Patient Name: [name] [name] Date of Encounter: 08/10/2017  Primary Cardiologist: Carlyle Dolly, MD   Subjective   Very somnolent this am.  Had a seizure yesterday.  Very weak.  Maintaining NSR on exam on IV Amio  Inpatient Medications    Scheduled Meds: . bisacodyl  10 mg Rectal Once  . chlorhexidine  15 mL Mouth Rinse BID  . dexamethasone  4 mg Oral Q6H  . dextromethorphan-guaiFENesin  1 tablet Oral BID  . feeding supplement  1 Container Oral TID BM  . fluticasone  1 spray Each Nare Daily  . folic acid  1 mg Oral Daily  . mouth rinse  15 mL Mouth Rinse q12n4p  . multivitamin with minerals  1 tablet Oral Daily  . nicotine  21 mg Transdermal Daily  . oxybutynin  5 mg Oral BID  . sertraline  100 mg Oral Daily  . tamsulosin  0.4 mg Oral Daily  . thiamine  100 mg Oral Daily  . topiramate  100 mg Oral BID  . traMADol  50 mg Oral Q6H   Continuous Infusions: . amiodarone 30 mg/hr (08/10/17 0600)  . small volume/piggyback builder 0 mL/hr at 08/08/17 0700  . cefTRIAXone (ROCEPHIN)  IV Stopped (08/10/17 0320)  . dextrose 5 % and 0.9 % NaCl with KCl 20 mEq/L 50 mL/hr at 08/10/17 0600  . levETIRAcetam Stopped (08/10/17 0022)   PRN Meds: acetaminophen **OR** acetaminophen, bisacodyl, guaiFENesin-dextromethorphan, ipratropium-albuterol, LORazepam, LORazepam, ondansetron **OR** ondansetron (ZOFRAN) IV, oxyCODONE   Vital Signs    Vitals:   08/10/17 0600 08/10/17 0700 08/10/17 0800 08/10/17 0817  BP: 99/65 99/68 (!) 84/63   Pulse: (!) 59 (!) 57 60   Resp: 19 11 14    Temp:    (!) 94.2 F (34.6 C)  TempSrc:    Axillary  SpO2: 97% 93% 96%   Weight:      Height:        Intake/Output Summary (Last 24 hours) at 08/10/2017 0901 Last data filed at 08/10/2017 0800 Gross per 24 hour  Intake 1724.62 ml  Output 1030 ml  Net 694.62 ml   Filed Weights   08/05/17 2011 08/06/17 0511 08/09/17 0400  Weight: 152 lb 1.9 oz (69 kg) 152 lb 1.9 oz (69 kg) 167 lb 8.8 oz  (76 kg)    Telemetry    NSR - Personally Reviewed  ECG    No new EKG to review - Personally Reviewed  Physical Exam   GEN: No acute distress.   Neck: No JVD Cardiac: RRR, no murmurs, rubs, or gallops.  Respiratory: Clear to auscultation bilaterally. GI: Soft, nontender, non-distended  MS: No edema; No deformity. Neuro:  Nonfocal  Psych: Normal affect   Labs    Chemistry Recent Labs  Lab 08/07/17 0342 08/08/17 0501 08/09/17 0550 08/10/17 0500  NA 127* 128* 130* 132*  K 3.8 3.8 3.7 4.0  CL 93* 95* 98 100  CO2 17* 17* 21* 24  GLUCOSE 92 90 100* 135*  BUN 71* 74* 62* 52*  CREATININE 2.06* 2.06* 1.63* 1.45*  CALCIUM 8.5* 8.7* 8.2* 8.2*  PROT 6.4* 5.7*  --  5.1*  ALBUMIN 2.4* 2.4*  --  2.0*  AST 72* 65*  --  32  ALT 33 37  --  22  ALKPHOS 258* 271*  --  210*  BILITOT 1.3* 1.2  --  0.8  GFRNONAA 32* 32* 42* 49*  GFRAA 37* 37* 49* 56*  ANIONGAP 17* 16* 11 8  Hematology Recent Labs  Lab 08/07/17 0342 08/09/17 0550 08/10/17 0500  WBC 12.1* 8.0 9.4  RBC 4.01* 3.70* 3.76*  HGB 11.2* 10.6* 10.8*  HCT 34.8* 32.5* 34.0*  MCV 86.8 87.8 90.4  MCH 27.9 28.6 28.7  MCHC 32.2 32.6 31.8  RDW 14.4 15.9* 16.9*  PLT 168 122* 129*    Cardiac Enzymes Recent Labs  Lab 08/04/17 2312  TROPONINI <0.03   No results for input(s): TROPIPOC in the last 168 hours.   BNPNo results for input(s): BNP, PROBNP in the last 168 hours.   DDimer No results for input(s): DDIMER in the last 168 hours.   Radiology    Mr Brain Wo Contrast  Result Date: 08/09/2017 CLINICAL DATA:  Metastatic cancer. EXAM: MRI HEAD WITHOUT CONTRAST TECHNIQUE: Multiplanar, multiecho pulse sequences of the brain and surrounding structures were obtained without intravenous contrast. COMPARISON:  CT head 08/04/2017 FINDINGS: Brain: The patient was not able to complete the study. Diffusion, T2, and FLAIR imaging of the brain were performed. Intravenous contrast was not administered. Mild generalized  atrophy. Negative for hydrocephalus. Negative for acute infarct. Mild chronic ischemic changes in the white matter. Vascular: Normal arterial flow voids Skull and upper cervical spine: No skull lesion identified. Sinuses/Orbits: Paranasal sinuses clear.  Normal orbit. Other: None IMPRESSION: No acute abnormality. Atrophy and mild chronic microvascular ischemia The patient was not able to complete the study and intravenous contrast was not administered. Electronically Signed   By: Franchot Gallo M.D.   On: 08/09/2017 15:33   Dg Chest Port 1 View  Result Date: 08/09/2017 CLINICAL DATA:  Status post pericardial window creation for pericardial effusion. History of prostate carcinoma EXAM: PORTABLE CHEST 1 VIEW COMPARISON:  August 08, 2017 FINDINGS: Pericardial drainage catheter position unchanged. Central catheter tip is in the superior vena cava. There is no appreciable pneumothorax. There are pleural effusions bilaterally with bibasilar consolidation, stable. There is stable cardiomegaly with pulmonary venous hypertension. No adenopathy. No evident bone lesions. There is aortic atherosclerosis. IMPRESSION: Tube and catheter positions as described. No evident pneumothorax. There is underlying pulmonary vascular congestion. There are pleural effusions bilaterally which appear to be layering. There is consolidation in both lung bases. No new opacity. There is aortic atherosclerosis. Aortic Atherosclerosis (ICD10-I70.0). Electronically Signed   By: Lowella Grip III M.D.   On: 08/09/2017 07:04   Dg Chest Port 1 View  Result Date: 08/08/2017 CLINICAL DATA:  Pericardial effusion. EXAM: PORTABLE CHEST 1 VIEW COMPARISON:  Radiographs of August 05, 2017. FINDINGS: Stable cardiomegaly. No pneumothorax is noted. Right internal jugular catheter is noted with tip in expected position of the SVC. Mild bibasilar opacities are noted concerning for atelectasis and possible associated pleural effusions. Catheter is seen  positioned over the cardiac apex consistent with pericardial drainage catheter. Bony thorax is unremarkable. IMPRESSION: Mild bibasilar opacities are noted concerning for subsegmental listhesis with associated pleural effusions. Interval placement of probable pericardial drainage catheter seen over cardiac apex. Right internal jugular catheter is noted with distal tip in expected position of the SVC. Electronically Signed   By: Marijo Conception, M.D.   On: 08/08/2017 19:29    Cardiac Studies   2D echo 08/08/2017 Study Conclusions  - Left ventricle: The cavity size was normal. Systolic function was normal. The estimated ejection fraction was in the range of 55% to 60%. Wall motion was normal; there were no regional wall motion abnormalities. - Inferior vena cava: The vessel was dilated. The respirophasic diameter changes were blunted (<50%), consistent  with elevated central venous pressure. - Pericardium, extracardiac: A large, free-flowing pericardial effusion was identified circumferential to the heart. The fluid had no internal echoes. Multiple small mobile echodensities were attached to the epicardial surface. There was mildright atrial chamber collapse for less than 50% of the cardiac cycle. Respirophasic change in stroke volume was excessive. Features were consistent with tamponade physiology.  Urgent and Critical Findings: A critical finding, cardiac tamponade, was reported to Dr. Wyline Copas , an attending physician responsible for the patient , by Dr.Croitoru , on 08/08/2017 , at 11:51 AM. Impressions:  - compared to the previous study on 08/05/2017, there are more convincing findings for pericardial tamponade.    Patient Profile     67 y.o. male who was found to have AKI, multiple nodules on CT concerning for metastatic disease of unknown primary source, and large pericardial effusion  Assessment & Plan    1. Pericardial effusion - in  the setting of probable diffuse metastatic disease on CT - echo large pericardial effusion with tamponade physiology. -s/p subxyphoid pericardial window with >500cc bloody fluid             - suspect malignant effusion - cytology pending             - hemodynamics improved after drainage but BP remains soft  - cultures negative thus far and path pending             - repeat limited echo today  2. Pleural effusion: underwent thoracentesis on 08/05/2017 with removal of 360 ml of yellow colored fluid.   3. Lung nodule: newly diagnosed, likely metastatic cancer of unknown primary source.   4. AKI -creatinine continues to improve (peaked at 2.16>>1.45) -continue to follow  5. Hyponatremia -sodium continues to improve (was 127>>132)  6. HTN -BP remains soft off antihypertensive meds  7.  PAF -remains in NSR on Amio gtt -not a candidate for anticoagulation due to bloody effusion -due to poor PO intake, will keep on IV Amio for now  8.  Seizure -MRI pending this am -?metastatic dz  9.  Debilitation -likely related to metastatic disease with unknown primary noted on CT -he continues to decline and is very weak -very lethargic today -Palliative Care consult has been placed        For questions or updates, please contact [place] Please consult [email or web address] for contact info under Cardiology/STEMI.      Signed, Fransico Him, MD  08/10/2017, 9:01 AM

## 2017-08-10 NOTE — Progress Notes (Signed)
[facility] BRIEF SICU PROGRESS NOTE  2 Days Post-Op  S/P Procedure(s) (LRB): SUBXYPHOID PERICARDIAL WINDOW (N/A) TRANSESOPHAGEAL ECHOCARDIOGRAM (TEE)   Stable day Input from palliative care team appreciated Pericardial fluid cytology still pending at this time TTNA of one of several lung nodules could be considered to establish diagnosis if pericardial fluid non-diagnostic and definitive diagnosis mandatory to direct care - I would not be in favor of bronch w/ transbronchial biopsy  Plan: Leave pericardial tube until output decreases  Rexene Alberts, MD 08/10/2017 5:16 PM

## 2017-08-10 NOTE — Procedures (Signed)
ELECTROENCEPHALOGRAM REPORT   Patient: [name] [name]       Room #: [facility] EEG No. ID: [id number] Age: 67 y.o.        Sex: male Referring Physician: Erlinda Hong Report Date:  08/10/2017        Interpreting Physician: Alexis Goodell  History: [name] is an 67 y.o. male with altered mental status  Medications:  Zithromax, Keppra, Decadron, Flonase, Folvite, MVI, Ditropan, Zoloft, Flomax, B1, Topamax, Tramadol, Rocephin, Dulcolax, Amiodarone  Conditions of Recording:  This is a 21 channel routine scalp EEG performed with bipolar and monopolar montages arranged in accordance to the international 10/20 system of electrode placement. One channel was dedicated to EKG recording.  The patient is in the awake, drowsy and asleep states.  Description:  The waking background activity consists of a low voltage, symmetrical, fairly well organized, 7 Hz theta activity, seen from the parieto-occipital and posterior temporal regions.  Low voltage fast activity, poorly organized, is seen anteriorly and is at times superimposed on more posterior regions.  A mixture of theta and alpha rhythms are seen from the central and temporal regions. The patient drowses with slowing to irregular, low voltage theta and beta activity.   The patient goes in to a light sleep with symmetrical sleep spindles, vertex central sharp transients and irregular slow activity.   No epileptiform activity is noted.   Hyperventilation and intermittent photic stimulation were not performed.  IMPRESSION: This is an abnormal EEG secondary to mild posterior background slowing.  This finding may be seen with a diffuse gray matter disturbance that is etiologically nonspecific, but may include a dementia, among other possibilities.  No epileptiform activity is noted.     Alexis Goodell, MD Neurology [phone] 08/10/2017, 12:21 PM

## 2017-08-10 NOTE — Consult Note (Signed)
PULMONARY / CRITICAL CARE MEDICINE   Name: [name] [name] MRN: [id number] DOB: [birth year]    ADMISSION DATE:  08/04/2017 CONSULTATION DATE:  08/10/17  REFERRING MD:  Dr Roxy Manns  CHIEF COMPLAINT: metastatic malignancy  HISTORY OF PRESENT ILLNESS:   HPI: [name] [name] is a 67 y.o. male with medical history significant for prostate cancer-not currently on treatment, prior history of alcohol abuse, hypertension, anxiety/depression, and essential tremors who presents to the emergency department with vague symptoms of sinus congestion as well as some weakness and disequilibrium.  He also states that he has lost appetite because he cannot have a bowel movement and cannot state when his last bowel movement was.  He is a poor historian, but seems to think that he cannot cough up phlegm and appears to think that he is congested and "clogged up." He denies any fevers, chills, chest pain, dyspnea, lower extremity edema, nausea, vomiting, or diarrhea. He has lost between 50-100 lbs. In the past several months He typically follows up with his physicians at [facility] Since his arrival the patient was noted to have a large circumferential pericardial effusionand he needed an urgent pericardial drainag and window procedure. He has ha bilateral effusions. CT Chest :: Bilateral pulmonary nodules, mediastinal and retroperitoneal adenopathy, hepatic lesions, omental and peritoneal implants, and bilateral pleural effusions and ascites. The constellation of findings most consistent with metastatic disease of indeterminate primary. Further evaluation with CT of the abdomen and pelvis with oral and IV contrast on a nonemergent basis recommended. Sampling of the pleural effusion or ascites may provide diagnostic cytology. 2. Large pericardial effusion measuring 3 cm in thickness. Echocardiogram may provide better evaluation of the pericardial effusion and cardiac function.   I was asked to see the patient  for the purpose of suggesting additional diagnostic procedures that may confirm a biopsy if his pericardialmfluid cytology were negative      PAST MEDICAL HISTORY :  He  has a past medical history of Cancer ([place]), Hypertension, and Pericardial effusion (08/05/2017).  PAST SURGICAL HISTORY: He  has a past surgical history that includes Subxyphoid pericardial window (N/A, 08/08/2017) and TEE without cardioversion (08/08/2017).  Allergies  Allergen Reactions  . Bee Venom Anaphylaxis    No current facility-administered medications on file prior to encounter.    Current Outpatient Medications on File Prior to Encounter  Medication Sig  . amLODipine (NORVASC) 5 MG tablet Take 5 mg by mouth daily.  . clonazePAM (KLONOPIN) 1 MG tablet Take 1 mg by mouth 3 (three) times daily.  . cloNIDine (CATAPRES) 0.1 MG tablet Take 0.1 mg by mouth 2 (two) times daily.  Marland Kitchen gabapentin (NEURONTIN) 300 MG capsule Take 300 mg by mouth 2 (two) times daily.  Marland Kitchen oxybutynin (DITROPAN) 5 MG tablet Take 5 mg by mouth 2 (two) times daily.   . sertraline (ZOLOFT) 100 MG tablet Take 100 mg by mouth daily.  . tamsulosin (FLOMAX) 0.4 MG CAPS capsule Take 0.4 mg by mouth daily.   Marland Kitchen topiramate (TOPAMAX) 100 MG tablet Take 100 mg by mouth 2 (two) times daily.    FAMILY HISTORY:  His family history is not on file.  SOCIAL HISTORY: He  reports that he has been smoking.  He has been smoking about 1.00 pack per day. He has never used smokeless tobacco. He reports that he drank alcohol. He reports that he does not use drugs.  REVIEW OF SYSTEMS:    Weight loss profound.  Generalized weakness. No nausea or vomiting.  No  change in urinary status. Had some sever constipation which helped prompt the admisssion  Physical Exam  Constitutional:  Cachectic middle aged man. Waxy and waning level of alertness  HENT:  Head: Normocephalic.  Mouth/Throat: Oropharynx is clear and moist.  Eyes: Pupils are equal, round, and reactive  to light.  Neck: Normal range of motion. Neck supple. No thyromegaly present.  Cardiovascular: Normal rate, regular rhythm and normal heart sounds.  Pulmonary/Chest: Breath sounds normal. No respiratory distress.  Decreased BS at the bases.  Abdominal: Soft. Bowel sounds are normal. He exhibits no distension. There is no tenderness.  Musculoskeletal: Normal range of motion.  Neurological: He is alert. He has normal reflexes.  Skin: Skin is warm and dry.  Psychiatric: Affect normal.      VITAL SIGNS: BP (!) 84/63   Pulse 60   Temp (!) 94.2 F (34.6 C) (Axillary)   Resp 14   Ht 5\' 8"  (1.727 m)   Wt 167 lb 8.8 oz (76 kg)   SpO2 96%   BMI 25.48 kg/m            INTAKE / OUTPUT: I/O last 3 completed shifts: In: 3252.3 [I.V.:2752.6; Other:100; IV Piggyback:399.7] Out: 2210 [Urine:1430; Chest Tube:780]   LABS:  BMET Recent Labs  Lab 08/08/17 0501 08/09/17 0550 08/10/17 0500  NA 128* 130* 132*  K 3.8 3.7 4.0  CL 95* 98 100  CO2 17* 21* 24  BUN 74* 62* 52*  CREATININE 2.06* 1.63* 1.45*  GLUCOSE 90 100* 135*    Electrolytes Recent Labs  Lab 08/04/17 2312  08/08/17 0501 08/09/17 0550 08/10/17 0500  CALCIUM 8.7*   < > 8.7* 8.2* 8.2*  MG 2.5*  --   --  2.5*  --    < > = values in this interval not displayed.    CBC Recent Labs  Lab 08/07/17 0342 08/09/17 0550 08/10/17 0500  WBC 12.1* 8.0 9.4  HGB 11.2* 10.6* 10.8*  HCT 34.8* 32.5* 34.0*  PLT 168 122* 129*    Coag's Recent Labs  Lab 08/08/17 1417  APTT 37*  INR 1.26    Sepsis Markers Recent Labs  Lab 08/04/17 2312 08/05/17 0122 08/05/17 0631  LATICACIDVEN 3.8* 4.2* 3.2*    ABG No results for input(s): PHART, PCO2ART, PO2ART in the last 168 hours.  Liver Enzymes Recent Labs  Lab 08/07/17 0342 08/08/17 0501 08/10/17 0500  AST 72* 65* 32  ALT 33 37 22  ALKPHOS 258* 271* 210*  BILITOT 1.3* 1.2 0.8  ALBUMIN 2.4* 2.4* 2.0*    Cardiac Enzymes Recent Labs  Lab 08/04/17 2312   TROPONINI <0.03    Glucose Recent Labs  Lab 08/04/17 2318  GLUCAP 120*    Imaging Mr Brain Wo Contrast  Result Date: 08/09/2017 CLINICAL DATA:  Metastatic cancer. EXAM: MRI HEAD WITHOUT CONTRAST TECHNIQUE: Multiplanar, multiecho pulse sequences of the brain and surrounding structures were obtained without intravenous contrast. COMPARISON:  CT head 08/04/2017 FINDINGS: Brain: The patient was not able to complete the study. Diffusion, T2, and FLAIR imaging of the brain were performed. Intravenous contrast was not administered. Mild generalized atrophy. Negative for hydrocephalus. Negative for acute infarct. Mild chronic ischemic changes in the white matter. Vascular: Normal arterial flow voids Skull and upper cervical spine: No skull lesion identified. Sinuses/Orbits: Paranasal sinuses clear.  Normal orbit. Other: None IMPRESSION: No acute abnormality. Atrophy and mild chronic microvascular ischemia The patient was not able to complete the study and intravenous contrast was not administered. Electronically Signed  By: Franchot Gallo M.D.   On: 08/09/2017 15:33     DISCUSSION: It is apparent from his various studies that the patient  Likely has metastatic cancer.  Whether it is primary lung, prostate cancer ( not likely) is an open question. He presented with pericardial tamponade. His current functional status is quite poor. It does not appear that patient can perform ADLs. He is profoundly weak and his level of consciousness appears to waver .  ASSESSMENT / PLAN:  Metastatic cancer It appears likely that the patient has metasatic cancer likely primary pulmonary. As far as where to pursue this remains a difficult question. A firm diagnosis could likely be had be Bronchoscopy and biopsying the right paratracheal node or right hilar adenoapthy. However, I am not sure thepatient would tolerate the procedure well and it amy ultimately lead to prolonged veventialtor support. In addition, I   Bblieve the candidate would not be a candidate for aggressive chemotherapy based on his poor overall prognosis and functional status. I have asked palliative care tosee him.  PULMONARY  The patient is on a nasal cannula presently. No excessive wob. He has bilateral effusions per CXR   CARDIOVASCULAR Patient is in a NSR Maintaining his BP ok. LVSF by recent ECHO was about 50%.  RENAL  Patient has a grade III CKD.  Latest creatinine 1.45      GASTROINTESTINAL  his appetitie is poor  HEMATOLOGIC  Mild anemia  INR 1.25 on 7/22      Pulmonary and [place] Pager: [phone] Cell [phone]  08/10/2017, 10:20 AM

## 2017-08-10 NOTE — Progress Notes (Signed)
EEG complete - results pending 

## 2017-08-10 NOTE — Progress Notes (Signed)
Pt refused boost supplement but asked for milk instead. So far that is the only nutrient he has agreed to take. Will continue to monitor.

## 2017-08-11 ENCOUNTER — Inpatient Hospital Stay (HOSPITAL_COMMUNITY): Payer: Medicare HMO

## 2017-08-11 DIAGNOSIS — I313 Pericardial effusion (noninflammatory): Secondary | ICD-10-CM

## 2017-08-11 LAB — CBC
HCT: 38.7 % — ABNORMAL LOW (ref 39.0–52.0)
HEMOGLOBIN: 12.3 g/dL — AB (ref 13.0–17.0)
MCH: 28.5 pg (ref 26.0–34.0)
MCHC: 31.8 g/dL (ref 30.0–36.0)
MCV: 89.8 fL (ref 78.0–100.0)
PLATELETS: 120 10*3/uL — AB (ref 150–400)
RBC: 4.31 MIL/uL (ref 4.22–5.81)
RDW: 16.9 % — ABNORMAL HIGH (ref 11.5–15.5)
WBC: 13.1 10*3/uL — AB (ref 4.0–10.5)

## 2017-08-11 LAB — BODY FLUID CULTURE: Culture: NO GROWTH

## 2017-08-11 LAB — ECHOCARDIOGRAM LIMITED
Height: 68 in
Weight: 2680.79 [oz_av]

## 2017-08-11 LAB — BASIC METABOLIC PANEL
Anion gap: 11 (ref 5–15)
BUN: 47 mg/dL — AB (ref 8–23)
CHLORIDE: 100 mmol/L (ref 98–111)
CO2: 24 mmol/L (ref 22–32)
Calcium: 8.7 mg/dL — ABNORMAL LOW (ref 8.9–10.3)
Creatinine, Ser: 1.57 mg/dL — ABNORMAL HIGH (ref 0.61–1.24)
GFR calc Af Amer: 51 mL/min — ABNORMAL LOW (ref >60)
GFR, EST NON AFRICAN AMERICAN: 44 mL/min — AB (ref >60)
GLUCOSE: 124 mg/dL — AB (ref 70–99)
POTASSIUM: 3.9 mmol/L (ref 3.5–5.1)
Sodium: 135 mmol/L (ref 135–145)

## 2017-08-11 LAB — MAGNESIUM: Magnesium: 2.6 mg/dL — ABNORMAL HIGH (ref 1.7–2.4)

## 2017-08-11 MED ORDER — LORAZEPAM 2 MG/ML IJ SOLN
0.5000 mg | INTRAMUSCULAR | Status: DC | PRN
  Administered 2017-08-12: 0.5 mg via INTRAVENOUS
  Filled 2017-08-11: qty 1

## 2017-08-11 MED ORDER — HYDROMORPHONE HCL 1 MG/ML IJ SOLN
0.5000 mg | INTRAMUSCULAR | Status: DC | PRN
  Administered 2017-08-12: 0.5 mg via INTRAVENOUS
  Filled 2017-08-11: qty 0.5

## 2017-08-11 MED ORDER — HYDROMORPHONE HCL 1 MG/ML IJ SOLN
0.5000 mg | Freq: Four times a day (QID) | INTRAMUSCULAR | Status: DC
  Administered 2017-08-11 – 2017-08-12 (×4): 0.5 mg via INTRAVENOUS
  Filled 2017-08-11 (×4): qty 0.5

## 2017-08-11 NOTE — Progress Notes (Signed)
  Echocardiogram 2D Echocardiogram has been performed.  [name] [name] 08/11/2017, 3:39 PM

## 2017-08-11 NOTE — Social Work (Signed)
Referral faxed to [facility], await response regarding bed availability. Continuing to follow.   Alexander Mt, [place] Work [phone]

## 2017-08-11 NOTE — Social Work (Signed)
CSW spoke with Palliative RN Threasa Beards, pt family preference is for [facility]. When Palliative note is input CSW will fax referral to [place] at [facility]= [phone].  Alexander Mt, [place] Work [phone]

## 2017-08-11 NOTE — Progress Notes (Addendum)
      [address]       [place] [address]             [phone]      3 Days Post-Op Procedure(s) (LRB): SUBXYPHOID PERICARDIAL WINDOW (N/A) TRANSESOPHAGEAL ECHOCARDIOGRAM (TEE) Subjective: Has pain around his pericardial tube site.   Objective: Vital signs in last 24 hours: Temp:  [94 F (34.4 C)-98 F (36.7 C)] 98 F (36.7 C) (07/25 0421) Pulse Rate:  [57-73] 72 (07/25 0600) Cardiac Rhythm: Normal sinus rhythm (07/24 2000) Resp:  [11-20] 18 (07/25 0600) BP: (84-120)/(52-85) 113/81 (07/25 0600) SpO2:  [89 %-97 %] 93 % (07/25 0600)     Intake/Output from previous day: 07/24 0701 - 07/25 0700 In: 1703.7 [I.V.:1403.6; IV Piggyback:300.1] Out: 1040 [Urine:820; Chest Tube:220] Intake/Output this shift: No intake/output data recorded.  General appearance: cooperative and no distress Heart: regular rate and rhythm, S1, S2 normal, no murmur, click, rub or gallop Lungs: clear to auscultation bilaterally Abdomen: Ascites  Extremities: extremities normal, atraumatic, no cyanosis or edema Wound: clean and dry, covered with sterile dressing  Lab Results: Recent Labs    08/09/17 0550 08/10/17 0500  WBC 8.0 9.4  HGB 10.6* 10.8*  HCT 32.5* 34.0*  PLT 122* 129*   BMET:  Recent Labs    08/09/17 0550 08/10/17 0500  NA 130* 132*  K 3.7 4.0  CL 98 100  CO2 21* 24  GLUCOSE 100* 135*  BUN 62* 52*  CREATININE 1.63* 1.45*  CALCIUM 8.2* 8.2*    PT/INR:  Recent Labs    08/08/17 1417  LABPROT 15.7*  INR 1.26   ABG No results found for: PHART, HCO3, TCO2, ACIDBASEDEF, O2SAT CBG (last 3)  No results for input(s): GLUCAP in the last 72 hours.  Assessment/Plan: S/P Procedure(s) (LRB): SUBXYPHOID PERICARDIAL WINDOW (N/A) TRANSESOPHAGEAL ECHOCARDIOGRAM (TEE)  1. CV-s/p subxyphoid pericardial window for a large bloody pericardial effusion. Remains on Amio IV now NSR in the 70s.BP improved, MAP stays in the 80s. Pericardial drain put out 220 cc in  24 hours-keep. >500cc evacuated in the OR.  2. Pulm-tolerating 3L [place] with good oxygen saturation. Chest xray today showed No pneumothorax, bilateral pleural effusions, and aortic atherosclerosis.  3. Renal- creatinine 1.45, AKI, trending down. Electrolytes okay. Replace as needed 4. H and H 10.8/34.0, stable 5. Platelets 129k, will trend 6. Endo-blood glucose well controlled 7. Neuro-seizures yesterday. MRI brain negative for metastasis. Now on Keppra and Ativan  Plan: continue pericardial drain, change to water seal. Work on pain management. Medical management per primary service.   LOS: 6 days    Elgie Collard 08/11/2017  I have seen and examined the patient and agree with the assessment and plan as outlined.  Place chest tube to water seal.  Hopefully cytology results will become available today.  Prognosis poor.  Rexene Alberts, MD 08/11/2017 8:06 AM    Preliminary results from cytology and surgical biopsy of the pericardium are consistent with metastatic disease with definite malignant cells.  Primary site remains unclear but additional stains are being run by pathology before issuing a final diagnosis.  I recommend palliative care.  Discussed results of cytology with patient at the bedside.  He verbalized understanding, although his ability to fully comprehend remains unclear.  Will try to meet with his family when they come in.   Rexene Alberts, MD 08/11/2017 1:09 PM

## 2017-08-11 NOTE — Progress Notes (Signed)
Progress Note  Patient Name: [name] [name] Date of Encounter: 08/11/2017  Primary Cardiologist: Carlyle Dolly, MD   Subjective   No further seizures and MRI neg for metastasis.  Remains in NSR.  Poor PO intake and now nauseated  Inpatient Medications    Scheduled Meds: . bisacodyl  10 mg Rectal Once  . chlorhexidine  15 mL Mouth Rinse BID  . dexamethasone  4 mg Oral Q6H  . dextromethorphan-guaiFENesin  1 tablet Oral BID  . feeding supplement  1 Container Oral TID BM  . fluticasone  1 spray Each Nare Daily  . folic acid  1 mg Oral Daily  . mouth rinse  15 mL Mouth Rinse q12n4p  . multivitamin with minerals  1 tablet Oral Daily  . nicotine  21 mg Transdermal Daily  . oxybutynin  5 mg Oral BID  . sertraline  100 mg Oral Daily  . tamsulosin  0.4 mg Oral Daily  . thiamine  100 mg Oral Daily  . topiramate  100 mg Oral BID  . traMADol  50 mg Oral Q6H   Continuous Infusions: . amiodarone 30 mg/hr (08/11/17 0600)  . small volume/piggyback builder 0 mL/hr at 08/08/17 0700  . cefTRIAXone (ROCEPHIN)  IV Stopped (08/11/17 0217)  . dextrose 5 % and 0.9 % NaCl with KCl 20 mEq/L Stopped (08/11/17 0527)  . levETIRAcetam Stopped (08/11/17 0055)   PRN Meds: acetaminophen **OR** acetaminophen, bisacodyl, guaiFENesin-dextromethorphan, ipratropium-albuterol, LORazepam, ondansetron **OR** ondansetron (ZOFRAN) IV, oxyCODONE   Vital Signs    Vitals:   08/11/17 0500 08/11/17 0600 08/11/17 0700 08/11/17 0800  BP: 106/76 113/81 106/76 107/72  Pulse: 69 72 72 73  Resp: 14 18 13 15   Temp:      TempSrc:      SpO2: 93% 93% 93% 93%  Weight:      Height:        Intake/Output Summary (Last 24 hours) at 08/11/2017 0826 Last data filed at 08/11/2017 0600 Gross per 24 hour  Intake 1620.36 ml  Output 1010 ml  Net 610.36 ml   Filed Weights   08/05/17 2011 08/06/17 0511 08/09/17 0400  Weight: 152 lb 1.9 oz (69 kg) 152 lb 1.9 oz (69 kg) 167 lb 8.8 oz (76 kg)    Telemetry    NSR -  Personally Reviewed  ECG    No new EKG to review - Personally Reviewed  Physical Exam   GEN: No acute distress.   Neck: No JVD Cardiac: RRR, no murmurs, rubs, or gallops.  Respiratory: Clear to auscultation bilaterally. GI: Soft, nontender, non-distended  MS: No edema; No deformity. Neuro:  Nonfocal  Psych: Normal affect   Labs    Chemistry Recent Labs  Lab 08/07/17 0342 08/08/17 0501 08/09/17 0550 08/10/17 0500  NA 127* 128* 130* 132*  K 3.8 3.8 3.7 4.0  CL 93* 95* 98 100  CO2 17* 17* 21* 24  GLUCOSE 92 90 100* 135*  BUN 71* 74* 62* 52*  CREATININE 2.06* 2.06* 1.63* 1.45*  CALCIUM 8.5* 8.7* 8.2* 8.2*  PROT 6.4* 5.7*  --  5.1*  ALBUMIN 2.4* 2.4*  --  2.0*  AST 72* 65*  --  32  ALT 33 37  --  22  ALKPHOS 258* 271*  --  210*  BILITOT 1.3* 1.2  --  0.8  GFRNONAA 32* 32* 42* 49*  GFRAA 37* 37* 49* 56*  ANIONGAP 17* 16* 11 8     Hematology Recent Labs  Lab 08/07/17 0342 08/09/17 0550  08/10/17 0500  WBC 12.1* 8.0 9.4  RBC 4.01* 3.70* 3.76*  HGB 11.2* 10.6* 10.8*  HCT 34.8* 32.5* 34.0*  MCV 86.8 87.8 90.4  MCH 27.9 28.6 28.7  MCHC 32.2 32.6 31.8  RDW 14.4 15.9* 16.9*  PLT 168 122* 129*    Cardiac Enzymes Recent Labs  Lab 08/04/17 2312  TROPONINI <0.03   No results for input(s): TROPIPOC in the last 168 hours.   BNPNo results for input(s): BNP, PROBNP in the last 168 hours.   DDimer No results for input(s): DDIMER in the last 168 hours.   Radiology    Mr Brain Wo Contrast  Result Date: 08/09/2017 CLINICAL DATA:  Metastatic cancer. EXAM: MRI HEAD WITHOUT CONTRAST TECHNIQUE: Multiplanar, multiecho pulse sequences of the brain and surrounding structures were obtained without intravenous contrast. COMPARISON:  CT head 08/04/2017 FINDINGS: Brain: The patient was not able to complete the study. Diffusion, T2, and FLAIR imaging of the brain were performed. Intravenous contrast was not administered. Mild generalized atrophy. Negative for hydrocephalus.  Negative for acute infarct. Mild chronic ischemic changes in the white matter. Vascular: Normal arterial flow voids Skull and upper cervical spine: No skull lesion identified. Sinuses/Orbits: Paranasal sinuses clear.  Normal orbit. Other: None IMPRESSION: No acute abnormality. Atrophy and mild chronic microvascular ischemia The patient was not able to complete the study and intravenous contrast was not administered. Electronically Signed   By: Franchot Gallo M.D.   On: 08/09/2017 15:33   Mr Brain W Contrast  Result Date: 08/10/2017 CLINICAL DATA:  Metastatic carcinoma unknown primary EXAM: MRI HEAD WITH CONTRAST TECHNIQUE: Multiplanar, multiecho pulse sequences of the brain and surrounding structures were obtained with intravenous contrast. CONTRAST:  25mL MULTIHANCE GADOBENATE DIMEGLUMINE 529 MG/ML IV SOLN COMPARISON:  MRI head without contrast 08/09/2017 FINDINGS: Postcontrast imaging performed. No enhancing mass lesion. Leptomeningeal enhancement is normal. Normal vascular enhancement. Ventricle size is normal. Normal calvarium.  No enhancing bone lesions. IMPRESSION: Negative for metastatic disease to the brain. Normal enhancement pattern. Electronically Signed   By: Franchot Gallo M.D.   On: 08/10/2017 11:54    Cardiac Studies   2D echo 08/08/2017 Study Conclusions  - Left ventricle: The cavity size was normal. Systolic function was normal. The estimated ejection fraction was in the range of 55% to 60%. Wall motion was normal; there were no regional wall motion abnormalities. - Inferior vena cava: The vessel was dilated. The respirophasic diameter changes were blunted (<50%), consistent with elevated central venous pressure. - Pericardium, extracardiac: A large, free-flowing pericardial effusion was identified circumferential to the heart. The fluid had no internal echoes. Multiple small mobile echodensities were attached to the epicardial surface. There was mildright  atrial chamber collapse for less than 50% of the cardiac cycle. Respirophasic change in stroke volume was excessive. Features were consistent with tamponade physiology.  Urgent and Critical Findings: A critical finding, cardiac tamponade, was reported to Dr. Wyline Copas , an attending physician responsible for the patient , by Dr.Croitoru , on 08/08/2017 , at 11:51 AM. Impressions:  - compared to the previous study on 08/05/2017, there are more convincing findings for pericardial tamponade.   Patient Profile     67 y.o. male who was found to have AKI, multiple nodules on CT concerning for metastatic disease of unknown primary source, and large pericardial effusion  Assessment & Plan    1. Pericardial effusion - in the setting of probable diffuse metastatic disease on CT - echo large pericardial effusionwithtamponade physiology. -s/p subxyphoidpericardial windowwith >500cc bloody  fluid - suspect malignant effusion - cytology still pending -BP improved today              -cultures negative thus far and path pending -limited echo to be done today  2. Pleural effusion: underwent thoracentesis on 08/05/2017 with removal of 360 ml of yellow colored fluid.   3. Lung nodule: newly diagnosed, likely metastatic cancer of unknown primary source.   4. AKI -creatinine continues to improve(peaked at 2.16>>1.45) -BMET pending today  5. Hyponatremia -sodium continues to improve (was 127>>132)  6. HTN -soft BP improved and now 107/79mmHg  7. PAF -remains in NSR on Amio gtt -not a candidate for anticoagulation due to bloody effusion -due to poor PO intake, will keep on IV Amio for now -if he continues to have nausea will d/c Amio  8.  Seizure -MRI with no metastatic dz -continue Keppra and Ativan  9.  Debilitation -likely related to metastatic disease with unknown primary noted on  CT -he continues to decline and is very weak -Palliative Care consult has been placed       For questions or updates, please contact [place] Please consult [email or web address] for contact info under Cardiology/STEMI.      Signed, Fransico Him, MD  08/11/2017, 8:26 AM

## 2017-08-11 NOTE — Care Management Note (Signed)
Case Management Note Previous CM note completed by Sherald Barge, RN 08/05/2017, 10:54 AM   Patient Details  Name: Oluwatomiwa Kinyon MRN: [id number] Date of Birth: 03-15-1950  Subjective/Objective:                 Admitted with CAP. Pt from home, lives alone normally but has a nephew staying with him currently. His only support person is an ex-wife. His PCP is in [place] and he requests resources for finding a PCP in the [place] area. Pt says he struggles with transportation issues.    Action/Plan: PT recommending SNF. CSW is aware and will make arrangements for placement. CM has provided a list of PCP's accepting pts in [facility].   Additional CM follow up notes: Pt tx to [facility]- now s/p pericardial window- cytology results with likely widespread metastatic disease. PC has been consulted.  CSW following for transition of care needs for residential hospice in [facility].    Expected Discharge Date:                  Expected Discharge Plan:  Skilled Nursing Facility/ Hospice Home  In-House Referral:  Clinical Social Work  Discharge planning Services  CM Consult  Post Acute Care Choice:  NA Choice offered to:  NA  DME Arranged:    DME Agency:     HH Arranged:    [place] Agency:     Status of Service:  Completed, signed off  If discussed at [employer] of Stay Meetings, dates discussed:    Discharge Disposition:   Additional Comments:  Dawayne Patricia, RN 08/11/2017, 4:42 PM

## 2017-08-11 NOTE — Progress Notes (Signed)
I spoke to the patient's daughter and ex-wife yesterday. I explained that I doid not believe it would be the patient's interest to have a bronchoscopy or for thast matter other aggressive procedures. The patient is refusing meds regularly. It appaears that he does not want additional procedures if they are uncomfortable or potentially painful. I told his family that it might be best to stop pursuing a firm diagnosis ans I am not sure the patient is a candidate for any aggressive therapy. Appreciate palliative's consult If the patient does not want aggressive therapy he should make himself DNR status. I will sign off at this time.

## 2017-08-11 NOTE — Progress Notes (Signed)
Physical Therapy Treatment Patient Details Name: [name] [name] MRN: [id number] DOB: 03/19/1950 Today's Date: 08/11/2017    History of Present Illness [name] [name] is a 67 y.o. male with medical history significant for prostate cancer-not currently on treatment, prior history of alcohol abuse, hypertension, anxiety/depression, and essential tremors. Admitted sinus congestion and weakness with pericardial effusion s/p pericardial window 7/22.     PT Comments    Pt continues to demonstrate very flat affect, limited interaction and no initiation for activity. Pt continues to roll eyes back in head throughout session but then focus on therapist in the next second with cues and continues to respond to questions when eyes are back. Pt without nausea and with very limited mobility progression this session. HR 74-86 with SpO2 92% on 2.5L    Follow Up Recommendations  SNF;Supervision for mobility/OOB     Equipment Recommendations  Rolling walker with 5" wheels    Recommendations for Other Services       Precautions / Restrictions Precautions Precautions: Fall Precaution Comments: chest tube    Mobility  Bed Mobility Overal bed mobility: Needs Assistance Bed Mobility: Supine to Sit     Supine to sit: Max assist;HOB elevated     General bed mobility comments: max assist to pivot Lower body to EOB with assist to lift trunk from elevated HOB  Transfers Overall transfer level: Needs assistance   Transfers: Sit to/from Stand;Stand Pivot Transfers Sit to Stand: Min assist;+2 physical assistance Stand pivot transfers: Mod assist;+2 physical assistance       General transfer comment: pt required cues to initiate standing but able to lift weight from bed with very little assist. with cues to pivot toward chair he would not initiate but once LLE assist to step he moved RLE on his own. Pt would not attempt further mobility  Ambulation/Gait             General Gait Details:  unable at this time   Stairs             Wheelchair Mobility    Modified Rankin (Stroke Patients Only)       Balance Overall balance assessment: Needs assistance Sitting-balance support: Feet supported;No upper extremity supported Sitting balance-Leahy Scale: Fair Sitting balance - Comments: EOB 3 min without physical assist   Standing balance support: No upper extremity supported;During functional activity Standing balance-Leahy Scale: Poor Standing balance comment: dependent on physical assist                            Cognition Arousal/Alertness: Awake/alert Behavior During Therapy: Flat affect Overall Cognitive Status: No family/caregiver present to determine baseline cognitive functioning                                 General Comments: Pt very flat, eyes rolling back throughout session although will respond with 1-4 word answers, pt does not demonstrate initiation or effortful movement throughout session      Exercises General Exercises - Lower Extremity Long Arc Quad: PROM;10 reps;Seated;Both Hip Flexion/Marching: PROM;Seated;10 reps;Both    General Comments        Pertinent Vitals/Pain Faces Pain Scale: Hurts little more Pain Location: all over Pain Descriptors / Indicators: Aching Pain Intervention(s): Limited activity within patient's tolerance;Repositioned    Home Living  Prior Function            PT Goals (current goals can now be found in the care plan section) Progress towards PT goals: Progressing toward goals(very limited)    Frequency           PT Plan Current plan remains appropriate    Co-evaluation              AM-PAC PT "6 Clicks" Daily Activity  Outcome Measure  Difficulty turning over in bed (including adjusting bedclothes, sheets and blankets)?: Unable Difficulty moving from lying on back to sitting on the side of the bed? : Unable Difficulty sitting  down on and standing up from a chair with arms (e.g., wheelchair, bedside commode, etc,.)?: Unable Help needed moving to and from a bed to chair (including a wheelchair)?: Total Help needed walking in hospital room?: Total Help needed climbing 3-5 steps with a railing? : Total 6 Click Score: 6    End of Session Equipment Utilized During Treatment: Gait belt Activity Tolerance: Patient limited by fatigue Patient left: in chair;with call bell/phone within [name];with chair alarm set Nurse Communication: Mobility status PT Visit Diagnosis: Unsteadiness on feet (R26.81);Other abnormalities of gait and mobility (R26.89);Muscle weakness (generalized) (M62.81)     Time: 8676-7209 PT Time Calculation (min) (ACUTE ONLY): 21 min  Charges:  $Therapeutic Activity: 8-22 mins                     [name] [name], [name] [name]    [name] [name] [name] [name] 08/11/2017, 2:08 PM

## 2017-08-11 NOTE — Progress Notes (Addendum)
Daily Progress Note   Patient Name: [name] [name]       Date: 08/11/2017 DOB: 10/16/1950  Age: 67 y.o. MRN#: [id number] Attending Physician: Florencia Reasons, MD Primary Care Physician: [name], MD Admit Date: 08/04/2017  Reason for Consultation/Follow-up: Establishing goals of care  Subjective:  patient was seen several times today, also discussed extensively with family members: ex-wife Kennyth Lose as well as patient's daughter Wylene Simmer [phone])  Events over the course of the last 24 hours noted. Discussed with TRH M.D, also discussed with bedside RN. Appreciate cardiothoracic surgery input and recommendations.  Family meeting initially with patient alone and then later on another family meeting ensued when patient's ex-wife and daughter arrived at the bedside.   See below.  Length of Stay: 6  Current Medications: Scheduled Meds:  . bisacodyl  10 mg Rectal Once  . chlorhexidine  15 mL Mouth Rinse BID  . dexamethasone  4 mg Oral Q6H  . dextromethorphan-guaiFENesin  1 tablet Oral BID  . feeding supplement  1 Container Oral TID BM  . fluticasone  1 spray Each Nare Daily  . folic acid  1 mg Oral Daily  . mouth rinse  15 mL Mouth Rinse q12n4p  . multivitamin with minerals  1 tablet Oral Daily  . nicotine  21 mg Transdermal Daily  . oxybutynin  5 mg Oral BID  . sertraline  100 mg Oral Daily  . tamsulosin  0.4 mg Oral Daily  . thiamine  100 mg Oral Daily  . topiramate  100 mg Oral BID  . traMADol  50 mg Oral Q6H    Continuous Infusions: . small volume/piggyback builder 0 mL/hr at 08/08/17 0700  . cefTRIAXone (ROCEPHIN)  IV Stopped (08/11/17 0217)  . dextrose 5 % and 0.9 % NaCl with KCl 20 mEq/L 50 mL/hr at 08/11/17 1500  . levETIRAcetam Stopped (08/11/17 1359)    PRN  Meds: acetaminophen **OR** acetaminophen, bisacodyl, guaiFENesin-dextromethorphan, ipratropium-albuterol, LORazepam, ondansetron **OR** ondansetron (ZOFRAN) IV, oxyCODONE  Physical Exam         Debilitated elderly appearing gentleman sitting in chair Alert and interactive at times, at times. His eyes backwards and does not interact much Shallow clear breath sounds S1-S2 still has pericardial drain No edema Muscle wasting Abdomen non tender   Vital Signs: BP 115/75   Pulse 75  Temp (!) 96.7 F (35.9 C) (Axillary)   Resp 16   Ht 5\' 8"  (1.727 m)   Wt 76 kg (167 lb 8.8 oz)   SpO2 (!) 83%   BMI 25.48 kg/m  SpO2: SpO2: (!) 83 % O2 Device: O2 Device: Nasal Cannula O2 Flow Rate: O2 Flow Rate (L/min): 3 L/min  Intake/output summary:   Intake/Output Summary (Last 24 hours) at 08/11/2017 1604 Last data filed at 08/11/2017 1500 Gross per 24 hour  Intake 1718.15 ml  Output 1230 ml  Net 488.15 ml   LBM: Last BM Date: (unknown) Baseline Weight: Weight: 68 kg (150 lb) Most recent weight: Weight: 76 kg (167 lb 8.8 oz)       Palliative Assessment/Data:      Patient Active Problem List   Diagnosis Date Noted  . Pericardial tamponade 08/08/2017  . Pleural effusion   . Prostate cancer ([place]) 08/05/2017  . Essential hypertension 08/05/2017  . AKI (acute kidney injury) ([place]) 08/05/2017  . Hyponatremia 08/05/2017  . Hypokalemia 08/05/2017  . Ileus ([place]) 08/05/2017  . Lactic acidosis 08/05/2017  . Community acquired pneumonia 08/05/2017  . Pulmonary nodule 08/05/2017  . Pneumonia 08/05/2017  . Pericardial effusion 08/05/2017    Palliative Care Assessment & Plan   Patient Profile:    Assessment:  pericardial effusion with tamponade physiology status post pericardial window High likelihood of diffuse metastatic disease of unknown primary Pericardial fluid cytology positive for malignant cells Ileus versus obstruction on abdominal imaging Paroxysmal atrial  fibrillation Acute kidney injury Lung nodules noted on imaging Seizures in this hospitalization   Recommendations/Plan:  Family meeting initially with patient alone as well as with family members as they arrived at the bedside. Hospital course noted. Appreciate input and recommendations from cardiovascular surgery service, hospital medicine service, pulmonary medicine colleagues. Neurology also following. Code  Status discussions undertaken. Goals of care discussions undertaken. Goals wishes and values discussed in detail. Discussed frankly and compassionately that the patient has high risk of ongoing decline in decompensation. Discussed about evidence of widely metastatic malignancy of unknown primary. Plan: Patient's CODE STATUS has been established as DO NOT RESUSCITATE/DO NOT INTUBATE. Fully agree with this.  Discussed with patient and family about hospice philosophy of care. Discussed with them also about comfort measures only. Discussed with them about judicious use of opioids and benzodiazepines for comfort focused care.  Clinical social worker consult has been requested. Patient is from [place], [place]. We discussed about [facility]. Social worker consult to help facilitate.  Agree with discontinuation of tubes and lines bothersome to the patient not contributory to his comfort at this point. May increase opioids and benzodiazepines on an as-needed basis for ongoing comfort care until the patient can be transferred to a residential hospice setting.   Prognosis likely less than 2 weeks discussed frankly and compassionately with daughter states he present at the bedside. She is tearful but aware. She desires full focus of comfort measures.   Code Status:    Code Status Orders  (From admission, onward)        Start     Ordered   08/11/17 1503  DNR (do not resuscitate)  Continuous    Question Answer Comment  In the event of cardiac or respiratory ARREST  Do not call a "code blue"   In the event of cardiac or respiratory ARREST Do not perform Intubation, CPR, defibrillation or ACLS   In the event of cardiac or respiratory ARREST Use medication by any route, position,  wound care, and other measures to relive pain and suffering. May use oxygen, suction and manual treatment of airway obstruction as needed for comfort.      08/11/17 1503    Code Status History    Date Active Date Inactive Code Status Order ID Comments User Context   08/05/2017 0257 08/11/2017 1503 Full Code [id number]  Rodena Goldmann, DO ED       Prognosis:   < 2 weeks  Discharge Planning:  Hospice facility  Care plan was discussed with  Patient, ex wife, daughter, bedside RN, Dr Erlinda Hong [facility] MD.   Thank you for allowing the Palliative Medicine Team to assist in the care of this patient.   Time In: 1500 Time Out: 1535 Total Time 35 Prolonged Time Billed  no       Greater than 50%  of this time was spent counseling and coordinating care related to the above assessment and plan.  Loistine Chance, MD [phone]  Please contact Palliative Medicine Team phone at [phone] for questions and concerns.

## 2017-08-11 NOTE — Progress Notes (Signed)
CTS  Pericardial drain out Pain is improved

## 2017-08-11 NOTE — Code Documentation (Signed)
TCTS BRIEF SICU PROGRESS NOTE  3 Days Post-Op  S/P Procedure(s) (LRB): SUBXYPHOID PERICARDIAL WINDOW (N/A) TRANSESOPHAGEAL ECHOCARDIOGRAM (TEE)   I discussed results of cytology again with the patient with his ex-wife and daughter at the bedside.  We discussed the poor prognosis and a variety of options, including whether or not to ask for a consult from the Oncology team.  They understand that the patient has likely widespread metastatic disease of unknown primary, and he is in no condition to consider any kind of aggressive therapy.  The patient expressed clear understanding and specifically agrees with changing CODE STATUS to make him NO CODE BLUE.  He would not wish to have CPR, cardioversion, nor to be placed on a ventilator.  We discussed management of the pericardial tube which is likely draining peritoneal fluid at this time.  The patient wants to have the tube removed despite the fact that he could develop a recurrent pericardial effusion.    Plan: Will ask the palliative care team to take over management  Rexene Alberts, MD 08/11/2017 2:56 PM

## 2017-08-11 NOTE — Progress Notes (Signed)
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 kub with ileus   Pericardial effusion -2D echo with findings of large pericardial effusion with borderline tamponade physiology. Clinically no signs of cardiac tamponade.  -Patient was transferred to [facility] from [place] for closer monitoring -Cardiology is following. Repeat 2d limited echo performed 7/22 with concerns of increasing pericardial effusion with tamonade -CTS was consulted and patient underwent pericardial window on 7/22 -Labs reviewed, pleural fluid cytology _ adenocarcinoma of unknown primary -he is transitioned to full comfort measures today on 7/25  Pulmonary nodules with bilateral pleural effusions and possible pneumonia -Patient is continued on empiric Rocephin and azithromycin. -presently on minimal O2 requirements as per above -patient is s/p thoracentesis yielding just over 300cc fluid,  -Labs reviewed. Cytology still pending this AM. If unequivocal, then consider IR bipsy. critical care recommended donot pursue diagnostic testing, critical care recommended palliative care consult -palliative care consulted, will follow recommendatiions -he is transitioned to full comfort measures today on  7/25  Acute kidney injury -Suspect prerenal in etiology -Patient was continued on gentle IVF hydration.  -Labs reviewed. Renal function has now improved he is transitioned to full comfort measures today on 7/25  Hyponatremia -Possible prerenal versus SIADH with pneumonia/malignancy.  -Improvement noted with hydration.  -Patient continued on IVF hydration -he is transitioned to full comfort measures today on 7/25  Elevated lactic acid -Clinically not septic -Most recent levels had improved, labs reviewed -he is transitioned to full comfort measures today on 7/25  Tobacco abuse -Counseled strongly on cessation. Done at bedside -Smokes 1/2 pack/day prior to admission -Patient to continue nicotine patch  Alcohol abuse -No evidence of withdrawals this morning -Pt has been continue on CIWA. -he is transitioned to full comfort measures today on 7/25  Possible seizure, he is started on iv keppra from 7/23 Mri brain negative for mets eeg "mild posterior background slowing.  This finding may be seen with a diffuse gray matter disturbance that is etiologically nonspecific, but may include a dementia, among other possibilities.  No epileptiform activity is noted. " - he is transitioned to full comfort measures today on 7/25  Anxiety and depression -appears stable at present -Patient reports not tolerating klonopin prior to admit and had not been taking --he is transitioned to full comfort measures today on 7/25  PAF -Patient noted to have afib RVR overnight following pericardial window -converted to sinus rhythm on amiodarone gtt --Cardiology consulted -he is transitioned to full comfort measures today on 7/25     Code Status: DNR Family Communication: Pt in room, daughter and wife Disposition Plan: residential hospice  Consultants:   Cardiology  IR  CTS  Palliative care  Pulmonary/critical care  Procedures:   Thoracentesis 7/19 Subxyphoid Pericardial  Window 7/22    Antibiotics:  As above   Objective: BP 117/79   Pulse 79   Temp (!) 97.4 F (36.3 C) (Oral)   Resp (!) 23   Ht 5\' 8"  (1.727 m)   Wt 76 kg (167 lb 8.8 oz)   SpO2 90%   BMI 25.48 kg/m   Intake/Output Summary (Last 24 hours) at 08/11/2017 1206 Last data filed at 08/11/2017 1100 Gross per 24 hour  Intake 1729.08 ml  Output 1190 ml  Net 539.08 ml   Filed Weights   08/05/17 2011 08/06/17 0511 08/09/17 0400  Weight: 69 kg (152 lb 1.9 oz) 69 kg (152 lb 1.9 oz) 76 kg (167 lb 8.8 oz)    Exam: Patient is examined daily including today on 08/11/2017, exams remain the same as of yesterday except that has changed    General:  Weak and frail, thin  Cardiovascular: RRR, subxyphoid pericardial window with drain removed  Respiratory: poor respiration effort, no wheezing, no rales,   Abdomen: Soft/ND/NT, positive BS  Musculoskeletal: No Edema  Neuro: lethargic, oriented to person and place , not to time  Data Reviewed: Basic Metabolic Panel: Recent Labs  Lab 08/04/17 2312  08/07/17 0342 08/08/17 0501 08/09/17 0550 08/10/17 0500 08/11/17 0728  NA 125*   < > 127* 128* 130* 132* 135  K 3.1*   < > 3.8 3.8 3.7 4.0 3.9  CL 87*   < > 93* 95* 98 100 100  CO2 21*   < > 17* 17* 21* 24 24  GLUCOSE 117*   < > 92 90 100* 135* 124*  BUN 54*   < > 71* 74* 62* 52* 47*  CREATININE 2.16*   < > 2.06* 2.06* 1.63* 1.45* 1.57*  CALCIUM 8.7*   < > 8.5* 8.7* 8.2* 8.2* 8.7*  MG 2.5*  --   --   --  2.5*  --  2.6*   < > = values in this interval not displayed.   Liver Function Tests: Recent Labs  Lab 08/04/17 2312 08/07/17 0342 08/08/17 0501 08/10/17 0500  AST 42* 72* 65* 32  ALT 20 33 37 22  ALKPHOS 262* 258* 271* 210*  BILITOT 1.6* 1.3* 1.2 0.8  PROT 7.3 6.4* 5.7* 5.1*  ALBUMIN 2.9* 2.4* 2.4* 2.0*   No results for input(s): LIPASE, AMYLASE in the last 168 hours. Recent Labs  Lab 08/04/17 2312  AMMONIA 10   CBC: Recent Labs  Lab 08/04/17 2312  08/07/17 0342 08/09/17 0550 08/10/17 0500 08/11/17 0728  WBC 13.8* 12.1* 8.0 9.4 13.1*  HGB 12.6* 11.2* 10.6* 10.8* 12.3*  HCT 37.0* 34.8* 32.5* 34.0* 38.7*  MCV 84.5 86.8 87.8 90.4 89.8  PLT 236 168 122* 129* 120*   Cardiac Enzymes:   Recent Labs  Lab 08/04/17 2312  TROPONINI <0.03   BNP (last 3 results) No results for input(s): BNP in the last 8760 hours.  ProBNP (last 3 results) No results for input(s): PROBNP in the last 8760 hours.  CBG: Recent Labs  Lab 08/04/17 2318  GLUCAP 120*    Recent Results (from the past 240 hour(s))  Culture, body fluid-bottle     Status: None   Collection Time: 08/05/17  1:31 PM  Result Value Ref Range Status   Specimen Description ASCITIC  Final   Special Requests BOTTLES DRAWN AEROBIC AND ANAEROBIC 10 CC EACH  Final   Culture   Final    NO GROWTH 5 DAYS Performed at [facility], [address] [place], [place] [address]  Report Status 08/10/2017 FINAL  Final  Gram stain     Status: None   Collection Time: 08/05/17  1:31 PM  Result Value Ref Range Status   Specimen Description PLEURAL  Final   Special Requests NONE  Final   Gram Stain   Final    CYTOSPIN SMEAR NO ORGANISMS SEEN WBC PRESENT, PREDOMINANTLY MONONUCLEAR Performed at [facility], [address], [place], [place] [address]    Report Status 08/05/2017 FINAL  Final  Acid Fast Smear (AFB)     Status: None   Collection Time: 08/05/17  1:31 PM  Result Value Ref Range Status   AFB Specimen Processing Concentration  Final   Acid Fast Smear Negative  Final    Comment: (NOTE) Performed At: [facility] [place], [place] [id number] Rush Farmer MD WC:[id number]    Source (AFB) PLEURAL  Final    Comment: Performed at [facility], [address] [place], [place] [address]  Body fluid culture     Status: None (Preliminary result)   Collection Time: 08/08/17  5:29 PM  Result Value Ref Range Status   Specimen Description FLUID PERICARDIAL   Final   Special Requests NONE  Final   Gram Stain   Final    ABUNDANT WBC PRESENT,BOTH PMN AND MONONUCLEAR NO ORGANISMS SEEN    Culture   Final    NO GROWTH 3 DAYS Performed at [facility], [place] [address] [place], [place] [address]    Report Status PENDING  Incomplete  Fungus Culture With Stain     Status: None (Preliminary result)   Collection Time: 08/08/17  5:29 PM  Result Value Ref Range Status   Fungus Stain Final report  Final    Comment: (NOTE) Performed At: [facility] [place], [place] [id number] Rush Farmer MD VO:[id number]    Fungus (Mycology) Culture PENDING  Incomplete   Fungal Source FLUID  Final    Comment: PERICARDIAL Performed at [facility], [place] [address], [place], [place] [address]   Acid Fast Smear (AFB)     Status: None   Collection Time: 08/08/17  5:29 PM  Result Value Ref Range Status   AFB Specimen Processing Concentration  Final   Acid Fast Smear Negative  Final    Comment: (NOTE) Performed At: [facility] [place], [place] [id number] Rush Farmer MD OF:[id number]    Source (AFB) FLUID  Final    Comment: PERICARDIAL Performed at [facility], [place] [address] [place], [place] [address]   Fungus Culture Result     Status: None   Collection Time: 08/08/17  5:29 PM  Result Value Ref Range Status   Result 1 Comment  Final    Comment: (NOTE) KOH/Calcofluor preparation:  no fungus observed. Performed At: [facility] [place], [place] [id number] Rush Farmer MD VQ:[id number]   MRSA PCR Screening     Status: None   Collection Time: 08/09/17  1:09 AM  Result Value Ref Range Status   MRSA by PCR NEGATIVE NEGATIVE Final    Comment:        The GeneXpert MRSA Assay (FDA approved for NASAL specimens only), is one component of a comprehensive MRSA colonization surveillance program. It is not intended to diagnose MRSA infection nor to guide or monitor  treatment for MRSA infections. Performed at [facility], [place] [address] [place], [place] [address]      Studies: No results  found.  Scheduled Meds: . bisacodyl  10 mg Rectal Once  . chlorhexidine  15 mL Mouth Rinse BID  . dexamethasone  4 mg Oral Q6H  . dextromethorphan-guaiFENesin  1 tablet Oral BID  . feeding supplement  1 Container Oral TID BM  . fluticasone  1 spray Each Nare Daily  . folic acid  1 mg Oral Daily  . mouth rinse  15 mL Mouth Rinse q12n4p  . multivitamin with minerals  1 tablet Oral Daily  . nicotine  21 mg Transdermal Daily  . oxybutynin  5 mg Oral BID  . sertraline  100 mg Oral Daily  . tamsulosin  0.4 mg Oral Daily  . thiamine  100 mg Oral Daily  . topiramate  100 mg Oral BID  . traMADol  50 mg Oral Q6H    Continuous Infusions: . amiodarone 30 mg/hr (08/11/17 1100)  . small volume/piggyback builder 0 mL/hr at 08/08/17 0700  . cefTRIAXone (ROCEPHIN)  IV Stopped (08/11/17 0217)  . dextrose 5 % and 0.9 % NaCl with KCl 20 mEq/L 50 mL/hr at 08/11/17 1100  . levETIRAcetam Stopped (08/11/17 0055)     Time spent: 39mins I have personally reviewed and interpreted on  08/11/2017 daily labs, tele strips, imagings as discussed above under date review session and assessment and plans.  I reviewed all nursing notes, pharmacy notes, consultant notes,  vitals, pertinent old records  I have discussed plan of care as described above with RN , patient  And familyon 08/11/2017   Florencia Reasons MD, PhD  [facility] Pager [phone]. If 7PM-7AM, please contact night-coverage at [email or web address], password [facility] 08/11/2017, 12:06 PM  LOS: 6 days

## 2017-08-11 NOTE — Progress Notes (Signed)
Pt now DNR and made comfort care. Per MD Xu's orders, monitor off and cardiac monitoring stopped. Pt in bed. Family at beside. Pain meds given as ordered. No new changes or complaints at this time. Will continue to monitor and provide comfort care.

## 2017-08-12 MED ORDER — LORAZEPAM 2 MG/ML PO CONC: 0.5000 mg | Freq: Four times a day (QID) | ORAL | 0 refills | Status: AC | PRN

## 2017-08-12 MED ORDER — OXYCODONE HCL 5 MG/5ML PO SOLN: 5.0000 mg | ORAL | 0 refills | Status: AC | PRN

## 2017-08-12 MED ORDER — DM-GUAIFENESIN ER 30-600 MG PO TB12: 1.0000 | ORAL_TABLET | Freq: Two times a day (BID) | ORAL | Status: DC | PRN

## 2017-08-12 MED ORDER — ATROPINE SULFATE 1 % OP SOLN
1.0000 [drp] | OPHTHALMIC | Status: DC | PRN
  Filled 2017-08-12: qty 2

## 2017-08-12 NOTE — Progress Notes (Signed)
Report called to Sigmund Hazel at [facility].

## 2017-08-12 NOTE — Progress Notes (Signed)
CSW spoke with Cassandra at [facility]- pt referral under review- possible bed available today but waiting for confirmation  Jorge Ny, [place] Social Worker [phone]

## 2017-08-12 NOTE — Progress Notes (Addendum)
      [address]       [place] [address]             [phone]      4 Days Post-Op Procedure(s) (LRB): SUBXYPHOID PERICARDIAL WINDOW (N/A) TRANSESOPHAGEAL ECHOCARDIOGRAM (TEE) Subjective: Feels much better with the pericardial drain removed. Pain is better controlled.   Objective: Vital signs in last 24 hours: Temp:  [96 F (35.6 C)-97.4 F (36.3 C)] 97.1 F (36.2 C) (07/26 0356) Pulse Rate:  [70-81] 72 (07/25 2000) Cardiac Rhythm: Normal sinus rhythm (07/25 1600) Resp:  [14-24] 14 (07/25 1700) BP: (96-127)/(71-83) 104/74 (07/25 2000) SpO2:  [83 %-97 %] 97 % (07/25 2000)    Intake/Output from previous day: 07/25 0701 - 07/26 0700 In: 766.8 [I.V.:666.8; IV Piggyback:100] Out: 535 [Urine:185; Chest Tube:350] Intake/Output this shift: No intake/output data recorded.  General appearance: cooperative and no distress Heart: regular rate and rhythm, S1, S2 normal, no murmur, click, rub or gallop Lungs: clear to auscultation bilaterally Abdomen: ascites present Extremities: extremities normal, atraumatic, no cyanosis or edema Wound: clean and dry  Lab Results: Recent Labs    08/10/17 0500 08/11/17 0728  WBC 9.4 13.1*  HGB 10.8* 12.3*  HCT 34.0* 38.7*  PLT 129* 120*   BMET:  Recent Labs    08/10/17 0500 08/11/17 0728  NA 132* 135  K 4.0 3.9  CL 100 100  CO2 24 24  GLUCOSE 135* 124*  BUN 52* 47*  CREATININE 1.45* 1.57*  CALCIUM 8.2* 8.7*    PT/INR: No results for input(s): LABPROT, INR in the last 72 hours. ABG No results found for: PHART, HCO3, TCO2, ACIDBASEDEF, O2SAT CBG (last 3)  No results for input(s): GLUCAP in the last 72 hours.  Assessment/Plan: S/P Procedure(s) (LRB): SUBXYPHOID PERICARDIAL WINDOW (N/A) TRANSESOPHAGEAL ECHOCARDIOGRAM (TEE)  1. CV-NSR in the 70s, BP stable. No longer on tele since he is now comfort care. Echocardiogram shows EF 50-55% and trivial pericardial effusion. Pericardial drain was removed  yesterday.  2. Pulm-tolerating 3L [place].  3. Renal-creatinine 1.57, electrolytes okay 4. H and H has been stable 12.3/38.7  5. Platelets 120k, stable 6. Neuro-seizures on 7/24. MRI brain negative for metastasis. Now on Keppra and Ativan 7. Cytology reviewed with the family by Dr. Roxy Manns showing metastatic disease. The patient was made a DNR.  Palliative care was consulted and is now following     LOS: 7 days    [name] [name] 08/12/2017  I have seen and examined the patient and agree with the assessment and plan as outlined.    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  Management per Palliative Care Team.  [name] Alberts, MD 08/12/2017 9:40 AM

## 2017-08-12 NOTE — Progress Notes (Signed)
Patient will discharge to [facility] Anticipated discharge date: 7/26 Transportation by [facility]- scheduled for 2:30pm  CSW signing off.  Jorge Ny, LCSW Clinical Social Worker [phone]

## 2017-08-12 NOTE — Discharge Summary (Signed)
Discharge Summary  [name] [name] YQM:[id number] DOB: 10/04/1950  PCP: [name], MD  Admit date: 08/04/2017 Discharge date: 08/12/2017  Time spent: 78mins, more than 50% time spent on coordination of care. Patient is discharge to residential hospice.   Discharge Diagnoses:  Active Hospital Problems   Diagnosis Date Noted  . Pericardial tamponade 08/08/2017  . Pleural effusion   . Prostate cancer ([place]) 08/05/2017  . Essential hypertension 08/05/2017  . AKI (acute kidney injury) ([place]) 08/05/2017  . Hyponatremia 08/05/2017  . Hypokalemia 08/05/2017  . Ileus ([place]) 08/05/2017  . Lactic acidosis 08/05/2017  . Community acquired pneumonia 08/05/2017  . Pulmonary nodule 08/05/2017  . Pneumonia 08/05/2017  . Pericardial effusion 08/05/2017    Resolved Hospital Problems  No resolved problems to display.    Discharge Condition: stable  Diet recommendation: diet as tolerated  Filed Weights   08/05/17 2011 08/06/17 0511 08/09/17 0400  Weight: 69 kg (152 lb 1.9 oz) 69 kg (152 lb 1.9 oz) 76 kg (167 lb 8.8 oz)    History of present illness: (per admitting MD Dr Manuella Ghazi) PCP: [name], MD   Patient coming from: Home  Chief Complaint: Sinus congestion/cough  HPI: [name] is a 67 y.o. male with medical history significant for prostate cancer-not currently on treatment, prior history of alcohol abuse, hypertension, anxiety/depression, and essential tremors who presents to the emergency department with vague symptoms of sinus congestion as well as some weakness and disequilibrium.  He also states that he has lost appetite because he cannot have a bowel movement and cannot state when his last bowel movement was.  He is a poor historian, but seems to think that he cannot cough up phlegm and appears to think that he is congested and "clogged up." He denies any fevers, chills, chest pain, dyspnea, lower extremity edema, nausea, vomiting, or diarrhea. He typically follows up  with his physicians at [facility].   ED Course: Vital signs are stable.  Laboratory data with some leukocytosis of 13,800.  Sodium at 125 with potassium 3.1.  BUN 54 and creatinine 2.16 with previously noted creatinine of 1.  Lactic acid has up trended from 3.8-4.2 despite administration of 2 L of IV fluid.  Chest x-ray and abdominal film demonstrates some small bilateral effusions with atelectasis versus infiltrate as well as some pulmonary nodules for which CT chest has been recommended.  There is also some questionable small bowel obstruction versus ileus findings on abdominal film.     Hospital Course:  Principal Problem:   Pericardial tamponade Active Problems:   Prostate cancer ([place])   Essential hypertension   AKI (acute kidney injury) ([place])   Hyponatremia   Hypokalemia   Ileus ([facility])   Lactic acidosis   Community acquired pneumonia   Pulmonary nodule   Pneumonia   Pleural effusion   Pericardial effusion    Pericardial effusion -[facility] with findings of large pericardial effusion with borderline tamponade physiology. Clinically no signs of cardiac tamponade.  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 -patient is s/p thoracentesis yielding just over 300cc fluid,  -cytology + adenocarcinoma  -  appreciate critical care/thoracic surgery input -patient is not a candidate for aggressive treatment palliative care consulted -he is transitioned to full comfort measures   on 7/25  N/V -side effect from amio? kub with ileus  --he is transitioned to full comfort measures  on 7/25  Acute kidney injury -Suspect prerenalin etiology -Patient receivedgentle IVF hydration.  -Labs reviewed. Renal functionhas now improved -he is transitioned to full comfort measures  on 7/25  Hyponatremia -Possible prerenal versus SIADH with pneumonia/malignancy.  -Improvement noted with hydration.  -he is transitioned to full comfort measures  on 7/25  Elevated lactic acid -Clinically not septic -Most recent levels had improved, labs reviewed -he is transitioned to full comfort measures on 7/25  Tobacco abuse -Counseled strongly on cessation. Done at bedside -Smokes 1/2 pack/dayprior to admission -Patient to continue nicotine patch  Alcohol abuse -No evidence of withdrawals this morning -Pt has been continue on CIWA. -he is transitioned to full comfort measures today on 7/25  Possible seizure, he is started on iv keppra from 7/23 Mri brain negative for mets eeg "mildposterior background slowing. This finding may be seen with a diffuse gray matter disturbance that is etiologically nonspecific, but may include a dementia, among other possibilities. No epileptiform activity is noted." -he is transitioned to full comfort measures  on 7/25  Anxiety and depression -appears stable at present -Patient reports not tolerating klonopin prior to admit and had not been taking --he is transitioned to full comfort measures on 7/25  PAF -Patient noted to have afib RVR overnight following pericardial window -converted to sinus rhythm on amiodarone gtt --Cardiology consulted -he is transitioned to full comfort measures  on 7/25     Code Status:DNR Family Communication:Pt in room, daughter and wife Disposition Plan:residential hospice  Consultants:  Cardiology  IR  CTS  Palliative care  Pulmonary/critical  care  Procedures:  Thoracentesis 7/19 Subxyphoid Pericardial Window 7/22    Antibiotics:  As above   Discharge Exam: BP 104/74   Pulse 72   Temp (!) 97.4 F (36.3 C) (Oral)   Resp 14   Ht 5\' 8"  (1.727 m)   Wt 76 kg (167 lb 8.8 oz)   SpO2 98%   BMI 25.48 kg/m   General: very frail, thin, appear comfortable Cardiovascular: RRR Respiratory: diminised   Discharge Instructions You were cared for by a hospitalist during your hospital stay. If you have any questions about your discharge medications or the care you received while you were in the hospital after you are discharged, you can call the unit and asked to speak with the hospitalist on call if the hospitalist that took care of you is not available. Once you are discharged, your primary care physician will handle any further medical issues. Please note that NO REFILLS for any discharge medications will be authorized once you are discharged, as it is imperative that you return to your primary care physician (or establish a relationship with a primary care physician if you do not have one) for your aftercare needs so that they can reassess your need for medications and monitor your lab values.  Discharge Instructions    Diet general   Complete by:  As directed    Increase activity slowly   Complete by:  As directed      Allergies as of 08/12/2017      Reactions   Bee Venom Anaphylaxis      Medication List    STOP taking these medications   amLODipine 5 MG tablet  Commonly known as:  NORVASC   clonazePAM 1 MG tablet Commonly known as:  KLONOPIN   cloNIDine 0.1 MG tablet Commonly known as:  CATAPRES   gabapentin 300 MG capsule Commonly known as:  NEURONTIN   oxybutynin 5 MG tablet Commonly known as:  DITROPAN   sertraline 100 MG tablet Commonly known as:  ZOLOFT     TAKE these medications   LORazepam 2 MG/ML concentrated solution Commonly known as:  ATIVAN Take 0.3 mLs (0.6 mg total) by mouth every  6 (six) hours as needed for anxiety, seizure, sedation or sleep.   oxyCODONE 5 MG/5ML solution Commonly known as:  ROXICODONE Take 5 mLs (5 mg total) by mouth every 4 (four) hours as needed for severe pain.   tamsulosin 0.4 MG Caps capsule Commonly known as:  FLOMAX Take 0.4 mg by mouth daily.   topiramate 100 MG tablet Commonly known as:  TOPAMAX Take 100 mg by mouth 2 (two) times daily.      Allergies  Allergen Reactions  . Bee Venom Anaphylaxis      The results of significant diagnostics from this hospitalization (including imaging, microbiology, ancillary and laboratory) are listed below for reference.    Significant Diagnostic Studies: Dg Chest 1 View  Result Date: 08/05/2017 CLINICAL DATA:  BILATERAL pleural effusions and pericardial effusion, post diagnostic LEFT thoracentesis EXAM: CHEST  1 VIEW COMPARISON:  CT chest 08/05/2017, chest radiograph 08/04/2017 FINDINGS: Enlargement of cardiac silhouette, known pericardial effusion by CT. Mediastinal contours and pulmonary vascularity normal. Probable bibasilar effusions. No pneumothorax following LEFT thoracentesis. Upper lungs clear. Mild atelectasis versus infiltrate at RIGHT base. Probable RIGHT nipple shadow. Bones demineralized. IMPRESSION: No pneumothorax following LEFT thoracentesis. Electronically Signed   By: Lavonia Dana M.D.   On: 08/05/2017 13:48   Dg Chest 2 View  Result Date: 08/05/2017 CLINICAL DATA:  67 year old male with weakness and constipation. History of prostate cancer. EXAM: ABDOMEN - 2 VIEW; CHEST - 2 VIEW COMPARISON:  None. FINDINGS: There are small bilateral pleural effusions with bibasilar atelectatic changes. Infiltrate is not excluded. Clinical correlation is recommended. There is an 11 mm pulmonary nodule seen on the lateral view. A 15 mm nodular density in the inferior right lung field may correspond to nipple shadow or represent a lung nodule. There is no pneumothorax. Mild cardiomegaly. There is  moderate stool within the colon. Mildly dilated loops of small bowel noted in the upper and mid abdomen which may represent an ileus versus obstruction. There is air within the colon. No free air noted. Small calcific density over the right renal silhouette may represent renal calculi. Prostate brachytherapy seeds noted. There is degenerative changes of the spine. No acute osseous pathology. IMPRESSION: 1. Small bilateral pleural effusions and bibasilar atelectasis versus infiltrate. 2. Pulmonary nodules. Further evaluation with chest CT on a nonemergent basis recommended. 3. Mildly dilated small bowel loops may represent an ileus versus obstruction. Clinical correlation is recommended. Electronically Signed   By: Anner Crete M.D.   On: 08/05/2017 00:35   Ct Head Wo Contrast  Result Date: 08/05/2017 CLINICAL DATA:  Nasal congestion starting a few weeks ago. EXAM: CT HEAD WITHOUT CONTRAST TECHNIQUE: Contiguous axial images were obtained from the base of the skull through the vertex without intravenous contrast. COMPARISON:  None. FINDINGS: Brain: Mild age related involutional changes the brain with chronic appearing small vessel ischemic disease periventricular white matter. No large vascular territory infarct, hemorrhage, intra-axial mass nor extra-axial fluid collections. Midline fourth ventricle and basal cisterns without  effacement. No hydrocephalus. Brainstem and cerebellum are nonacute. Vascular: No hyperdense vessel sign. Skull: Normal. Negative for fracture or focal lesion. Sinuses/Orbits: Clear paranasal sinuses and mastoids without significant mucosal thickening. Intact orbits and globes. Other: None IMPRESSION: Age related involutional changes of the brain with likely chronic small vessel ischemic disease. No acute intracranial abnormality. The included paranasal sinuses are clear. Electronically Signed   By: Ashley Royalty M.D.   On: 08/05/2017 00:23   Ct Chest Wo Contrast  Result Date:  08/05/2017 CLINICAL DATA:  67 year old male with pulmonary nodules and enlarged lymph nodes. EXAM: CT CHEST WITHOUT CONTRAST TECHNIQUE: Multidetector CT imaging of the chest was performed following the standard protocol without IV contrast. COMPARISON:  Chest radiograph dated 08/04/2017 FINDINGS: Evaluation of this exam is limited in the absence of intravenous contrast. Cardiovascular: There is no cardiomegaly. There is a large pericardial effusion measuring approximately 3 cm in thickness. Eighth cardia may provide better evaluation of the pericardial effusion and assessment for cardiac ejection fraction. There is mild atherosclerotic calcification of the thoracic aorta. The central pulmonary arteries are grossly unremarkable on this noncontrast CT. Mediastinum/Nodes: There are multiple enlarged mediastinal lymph nodes measuring up to 2.3 cm in the right paratracheal region and 3 cm in the subcarinal node. Esophagus is grossly unremarkable. Small and heterogeneous thyroid gland with possible tiny hypodense nodules and small calcific foci. Ultrasound may provide better evaluation of the thyroid gland if clinically indicated. Lungs/Pleura: There are small bilateral pleural effusions with associated partial compressive atelectasis of the lower lobes. Pneumonia is not excluded. Multiple bilateral pulmonary nodules measure up to 14 mm at the right lung base and 10 mm in the left upper lobe concerning for metastatic disease given findings of adenopathy and pleural effusion. There is no pneumothorax. There is centrilobular and paraseptal emphysema. The central airways are patent. Upper Abdomen: There is a small ascites. Two hypodense lesions in the right lobe of the liver measure up to 21 x 18 mm suspicious for metastatic disease. Partially visualized gallstones. Retroperitoneal adenopathy measure up to 2.4 cm to the left of the aorta. There are areas of nodularity in the upper mesentery and omentum concerning for  metastatic implant. Areas of nodularity along the diaphragms bilaterally also concerning for peritoneal implants. Musculoskeletal: No chest wall mass or suspicious bone lesions identified. IMPRESSION: 1. Bilateral pulmonary nodules, mediastinal and retroperitoneal adenopathy, hepatic lesions, omental and peritoneal implants, and bilateral pleural effusions and ascites. The constellation of findings most consistent with metastatic disease of indeterminate primary. Further evaluation with CT of the abdomen and pelvis with oral and IV contrast on a nonemergent basis recommended. Sampling of the pleural effusion or ascites may provide diagnostic cytology. 2. Large pericardial effusion measuring 3 cm in thickness. Echocardiogram may provide better evaluation of the pericardial effusion and cardiac function. These results were called by telephone at the time of interpretation on 08/05/2017 at 3:45 am to nurse Marcello Moores , who verbally acknowledged these results. Electronically Signed   By: Anner Crete M.D.   On: 08/05/2017 03:49   Mr Brain Wo Contrast  Result Date: 08/09/2017 CLINICAL DATA:  Metastatic cancer. EXAM: MRI HEAD WITHOUT CONTRAST TECHNIQUE: Multiplanar, multiecho pulse sequences of the brain and surrounding structures were obtained without intravenous contrast. COMPARISON:  CT head 08/04/2017 FINDINGS: Brain: The patient was not able to complete the study. Diffusion, T2, and FLAIR imaging of the brain were performed. Intravenous contrast was not administered. Mild generalized atrophy. Negative for hydrocephalus. Negative for acute infarct. Mild chronic ischemic  changes in the white matter. Vascular: Normal arterial flow voids Skull and upper cervical spine: No skull lesion identified. Sinuses/Orbits: Paranasal sinuses clear.  Normal orbit. Other: None IMPRESSION: No acute abnormality. Atrophy and mild chronic microvascular ischemia The patient was not able to complete the study and intravenous contrast was  not administered. Electronically Signed   By: Franchot Gallo M.D.   On: 08/09/2017 15:33   Mr Brain W Contrast  Result Date: 08/10/2017 CLINICAL DATA:  Metastatic carcinoma unknown primary EXAM: MRI HEAD WITH CONTRAST TECHNIQUE: Multiplanar, multiecho pulse sequences of the brain and surrounding structures were obtained with intravenous contrast. CONTRAST:  75mL MULTIHANCE GADOBENATE DIMEGLUMINE 529 MG/ML IV SOLN COMPARISON:  MRI head without contrast 08/09/2017 FINDINGS: Postcontrast imaging performed. No enhancing mass lesion. Leptomeningeal enhancement is normal. Normal vascular enhancement. Ventricle size is normal. Normal calvarium.  No enhancing bone lesions. IMPRESSION: Negative for metastatic disease to the brain. Normal enhancement pattern. Electronically Signed   By: Franchot Gallo M.D.   On: 08/10/2017 11:54   Dg Chest Port 1 View  Result Date: 08/09/2017 CLINICAL DATA:  Status post pericardial window creation for pericardial effusion. History of prostate carcinoma EXAM: PORTABLE CHEST 1 VIEW COMPARISON:  August 08, 2017 FINDINGS: Pericardial drainage catheter position unchanged. Central catheter tip is in the superior vena cava. There is no appreciable pneumothorax. There are pleural effusions bilaterally with bibasilar consolidation, stable. There is stable cardiomegaly with pulmonary venous hypertension. No adenopathy. No evident bone lesions. There is aortic atherosclerosis. IMPRESSION: Tube and catheter positions as described. No evident pneumothorax. There is underlying pulmonary vascular congestion. There are pleural effusions bilaterally which appear to be layering. There is consolidation in both lung bases. No new opacity. There is aortic atherosclerosis. Aortic Atherosclerosis (ICD10-I70.0). Electronically Signed   By: Lowella Grip III M.D.   On: 08/09/2017 07:04   Dg Chest Port 1 View  Result Date: 08/08/2017 CLINICAL DATA:  Pericardial effusion. EXAM: PORTABLE CHEST 1 VIEW  COMPARISON:  Radiographs of August 05, 2017. FINDINGS: Stable cardiomegaly. No pneumothorax is noted. Right internal jugular catheter is noted with tip in expected position of the SVC. Mild bibasilar opacities are noted concerning for atelectasis and possible associated pleural effusions. Catheter is seen positioned over the cardiac apex consistent with pericardial drainage catheter. Bony thorax is unremarkable. IMPRESSION: Mild bibasilar opacities are noted concerning for subsegmental listhesis with associated pleural effusions. Interval placement of probable pericardial drainage catheter seen over cardiac apex. Right internal jugular catheter is noted with distal tip in expected position of the SVC. Electronically Signed   By: Marijo Conception, M.D.   On: 08/08/2017 19:29   Dg Abd 2 Views  Result Date: 08/05/2017 CLINICAL DATA:  68 year old male with weakness and constipation. History of prostate cancer. EXAM: ABDOMEN - 2 VIEW; CHEST - 2 VIEW COMPARISON:  None. FINDINGS: There are small bilateral pleural effusions with bibasilar atelectatic changes. Infiltrate is not excluded. Clinical correlation is recommended. There is an 11 mm pulmonary nodule seen on the lateral view. A 15 mm nodular density in the inferior right lung field may correspond to nipple shadow or represent a lung nodule. There is no pneumothorax. Mild cardiomegaly. There is moderate stool within the colon. Mildly dilated loops of small bowel noted in the upper and mid abdomen which may represent an ileus versus obstruction. There is air within the colon. No free air noted. Small calcific density over the right renal silhouette may represent renal calculi. Prostate brachytherapy seeds noted. There is degenerative changes  of the spine. No acute osseous pathology. IMPRESSION: 1. Small bilateral pleural effusions and bibasilar atelectasis versus infiltrate. 2. Pulmonary nodules. Further evaluation with chest CT on a nonemergent basis recommended. 3.  Mildly dilated small bowel loops may represent an ileus versus obstruction. Clinical correlation is recommended. Electronically Signed   By: Anner Crete M.D.   On: 08/05/2017 00:35   Dg Abd Portable 1v  Result Date: 08/11/2017 CLINICAL DATA:  Nausea and vomiting EXAM: PORTABLE ABDOMEN - 1 VIEW COMPARISON:  08/04/2017 FINDINGS: Increasing small bowel distention. There is a paucity of colonic gas but gas is seen within the transverse colon. No concerning mass effect or gas collection. Prostate brachytherapy seeds. Pericardial drain that is partially covered. IMPRESSION: Progressive gaseous distension of small bowel. Some colonic gas is present; this could reflect partial obstruction or ileus. Electronically Signed   By: Monte Fantasia M.D.   On: 08/11/2017 12:48   US Thoracentesis Asp Pleural Space W/img Guide  Result Date: 08/05/2017 INDICATION: BILATERAL pleural effusions and pericardial effusion, for diagnostic thoracentesis EXAM: ULTRASOUND GUIDED DIAGNOSTIC LEFT THORACENTESIS MEDICATIONS: None. COMPLICATIONS: None immediate. PROCEDURE: Procedure, benefits, and risks of procedure were discussed with patient. Written informed consent for procedure was obtained. Time out protocol followed. Pleural effusion localized by ultrasound at the posterior LEFT hemithorax. Skin prepped and draped in usual sterile fashion. Skin and soft tissues anesthetized with 10 mL of 1% lidocaine. 8 French thoracentesis catheter placed into the LEFT pleural space. 360 mL of yellow LEFT pleural fluid aspirated by syringe pump. Procedure tolerated well by patient without immediate complication. FINDINGS: A total of approximately 360 mL of LEFT pleural fluid was removed. Samples were sent to the laboratory as requested by the clinical team. IMPRESSION: Successful ultrasound guided diagnostic LEFT thoracentesis yielding 360 mL of pleural fluid. Electronically Signed   By: Lavonia Dana M.D.   On: 08/05/2017 14:08     Microbiology: Recent Results (from the past 240 hour(s))  Culture, body fluid-bottle     Status: None   Collection Time: 08/05/17  1:31 PM  Result Value Ref Range Status   Specimen Description ASCITIC  Final   Special Requests BOTTLES DRAWN AEROBIC AND ANAEROBIC 10 CC EACH  Final   Culture   Final    NO GROWTH 5 DAYS Performed at [facility], [address] [place], [place] [address]    Report Status 08/10/2017 FINAL  Final  Gram stain     Status: None   Collection Time: 08/05/17  1:31 PM  Result Value Ref Range Status   Specimen Description PLEURAL  Final   Special Requests NONE  Final   Gram Stain   Final    CYTOSPIN SMEAR NO ORGANISMS SEEN WBC PRESENT, PREDOMINANTLY MONONUCLEAR Performed at [facility], [address] [place], [place] [address]    Report Status 08/05/2017 FINAL  Final  Acid Fast Smear (AFB)     Status: None   Collection Time: 08/05/17  1:31 PM  Result Value Ref Range Status   AFB Specimen Processing Concentration  Final   Acid Fast Smear Negative  Final    Comment: (NOTE) Performed At: [facility] [address] [place], [place] [id number] Rush Farmer MD [id number]    Source (AFB) PLEURAL  Final    Comment: Performed at [facility], [address] [place], [place] [address]  Body fluid culture     Status: None   Collection Time: 08/08/17  5:29 PM  Result Value Ref Range Status   Specimen Description  FLUID PERICARDIAL  Final   Special Requests NONE  Final   Gram Stain   Final    ABUNDANT WBC PRESENT,BOTH PMN AND MONONUCLEAR NO ORGANISMS SEEN    Culture   Final    NO GROWTH 3 DAYS Performed at [facility], [address], [place], [place] [address]    Report Status 08/11/2017 FINAL  Final  Fungus Culture With Stain     Status: None (Preliminary result)   Collection Time: 08/08/17  5:29 PM  Result Value Ref Range Status   Fungus Stain Final report  Final    Comment: (NOTE) Performed At: [facility] [place], [place] [id number] Rush Farmer MD [id number]    Fungus (Mycology) Culture PENDING  Incomplete   Fungal Source FLUID  Final    Comment: PERICARDIAL Performed at [facility], [place] [address] [place], [place] [address]   Acid Fast Smear (AFB)     Status: None   Collection Time: 08/08/17  5:29 PM  Result Value Ref Range Status   AFB Specimen Processing Concentration  Final   Acid Fast Smear Negative  Final    Comment: (NOTE) Performed At: [facility] [place], [place] [id number] Rush Farmer MD [id number]    Source (AFB) FLUID  Final    Comment: PERICARDIAL Performed at [facility], [place] [address] [place], [place] [address]   Fungus Culture Result     Status: None   Collection Time: 08/08/17  5:29 PM  Result Value Ref Range Status   Result 1 Comment  Final    Comment: (NOTE) KOH/Calcofluor preparation:  no fungus observed. Performed At: [facility] [place], [place] [id number] Rush Farmer MD BC:[id number]   MRSA PCR Screening     Status: None   Collection Time: 08/09/17  1:09 AM  Result Value Ref Range Status   MRSA by PCR NEGATIVE NEGATIVE Final    Comment:        The GeneXpert MRSA Assay (FDA approved for NASAL specimens only), is one component of a comprehensive MRSA colonization surveillance program. It is not intended to diagnose MRSA infection nor to guide or monitor treatment for MRSA infections. Performed at [facility], [place] [address], [place], [place] [address]      Labs: Basic Metabolic Panel: Recent Labs  Lab 08/07/17 0342 08/08/17 0501 08/09/17 0550 08/10/17 0500 08/11/17 0728  NA 127* 128* 130* 132* 135  K 3.8 3.8 3.7 4.0 3.9  CL 93* 95* 98 100 100  CO2 17* 17* 21* 24 24  GLUCOSE 92 90 100* 135* 124*  BUN 71* 74* 62* 52* 47*  CREATININE 2.06* 2.06* 1.63* 1.45* 1.57*  CALCIUM 8.5* 8.7* 8.2* 8.2* 8.7*  MG  --   --  2.5*  --  2.6*   Liver  Function Tests: Recent Labs  Lab 08/07/17 0342 08/08/17 0501 08/10/17 0500  AST 72* 65* 32  ALT 33 37 22  ALKPHOS 258* 271* 210*  BILITOT 1.3* 1.2 0.8  PROT 6.4* 5.7* 5.1*  ALBUMIN 2.4* 2.4* 2.0*   No results for input(s): LIPASE, AMYLASE in the last 168 hours. No results for input(s): AMMONIA in the last 168 hours. CBC: Recent Labs  Lab 08/07/17 0342 08/09/17 0550 08/10/17 0500 08/11/17 0728  WBC 12.1* 8.0 9.4 13.1*  HGB 11.2* 10.6* 10.8* 12.3*  HCT 34.8* 32.5* 34.0* 38.7*  MCV 86.8 87.8 90.4 89.8  PLT 168 122* 129* 120*  Cardiac Enzymes: No results for input(s): CKTOTAL, CKMB, CKMBINDEX, TROPONINI in the last 168 hours. BNP: BNP (last 3 results) No results for input(s): BNP in the last 8760 hours.  ProBNP (last 3 results) No results for input(s): PROBNP in the last 8760 hours.  CBG: No results for input(s): GLUCAP in the last 168 hours.     Signed:  Florencia Reasons MD, PhD  [facility] 08/12/2017, 11:13 AM

## 2017-08-12 NOTE — Progress Notes (Signed)
Nutrition Brief Note  Chart reviewed. Pt now transitioning to comfort care with plans to discharge to residential hospice. Recommend liberalizing diet No further nutrition interventions warranted at this time.  Please re-consult as needed.   Kerman Passey MS, RD, [place], [place] [phone] Pager  [phone] Weekend/On-Call Pager

## 2017-08-12 NOTE — Progress Notes (Signed)
Daily Progress Note   Patient Name: [name] [name]       Date: 08/12/2017 DOB: 1950-07-30  Age: 67 y.o. MRN#: [id number] Attending Physician: Florencia Reasons, MD Primary Care Physician: [name], MD Admit Date: 08/04/2017  Reason for Consultation/Follow-up: Establishing goals of care  Subjective:  Patient is resting in bed Niece who is an RN is at bedside, I have discussed again with her regarding the patient's grim prognosis and widespread malignancy and that the focus is now on comfort measures.   Awaiting hospice bed, ex-wife Kennyth Lose as well as patient's daughter Wylene Simmer [phone]) are reportedly also on their way to the hospital this morning.   Discussed with patient about asking for PRNs for symptom management, he is aware. Denies pain at present.      See below.  Length of Stay: 7  Current Medications: Scheduled Meds:  . bisacodyl  10 mg Rectal Once  . chlorhexidine  15 mL Mouth Rinse BID  . fluticasone  1 spray Each Nare Daily  .  HYDROmorphone (DILAUDID) injection  0.5 mg Intravenous Q6H  . mouth rinse  15 mL Mouth Rinse q12n4p  . nicotine  21 mg Transdermal Daily  . sertraline  100 mg Oral Daily  . topiramate  100 mg Oral BID  . traMADol  50 mg Oral Q6H    Continuous Infusions: . levETIRAcetam 500 mg (08/11/17 2356)    PRN Meds: acetaminophen **OR** acetaminophen, bisacodyl, dextromethorphan-guaiFENesin, HYDROmorphone (DILAUDID) injection, ipratropium-albuterol, LORazepam, LORazepam, ondansetron **OR** ondansetron (ZOFRAN) IV, oxyCODONE  Physical Exam         Debilitated elderly appearing gentleman sitting in bed Alert and interactive at times  Shallow clear breath sounds S1-S2   No edema Muscle wasting Abdomen non tender   Vital Signs: BP 104/74    Pulse 72   Temp (!) 97.4 F (36.3 C) (Oral)   Resp 14   Ht 5\' 8"  (1.727 m)   Wt 76 kg (167 lb 8.8 oz)   SpO2 98%   BMI 25.48 kg/m  SpO2: SpO2: 98 % O2 Device: O2 Device: Nasal Cannula O2 Flow Rate: O2 Flow Rate (L/min): 3 L/min  Intake/output summary:   Intake/Output Summary (Last 24 hours) at 08/12/2017 1026 Last data filed at 08/11/2017 2000 Gross per 24 hour  Intake 525.48 ml  Output 365 ml  Net 160.48 ml   LBM: Last BM Date: (unknown) Baseline Weight: Weight: 68 kg (150 lb) Most recent weight: Weight: 76 kg (167 lb 8.8 oz)       Palliative Assessment/Data:    Flowsheet Rows     Most Recent Value  Intake Tab  Referral Department  Hospitalist  Unit at Time of Referral  Med/Surg Unit  Palliative Care Primary Diagnosis  Cardiac  Date Notified  08/10/17  Palliative Care Type  New Palliative care  Reason for referral  Clarify Goals of Care  Date of Admission  08/04/17  Date first seen by Palliative Care  08/10/17  # of days Palliative referral response time  0 Day(s)  # of days IP prior to Palliative referral  6  Clinical Assessment  Psychosocial & Spiritual Assessment  Palliative Care Outcomes      Patient Active Problem List   Diagnosis Date Noted  . Pericardial tamponade 08/08/2017  . Pleural effusion   . Prostate cancer ([place]) 08/05/2017  . Essential hypertension 08/05/2017  . AKI (acute kidney injury) ([place]) 08/05/2017  . Hyponatremia 08/05/2017  . Hypokalemia 08/05/2017  . Ileus ([place]) 08/05/2017  . Lactic acidosis 08/05/2017  . Community acquired pneumonia 08/05/2017  . Pulmonary nodule 08/05/2017  . Pneumonia 08/05/2017  . 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    Prognosis likely less than 2 weeks   Code Status:    Code Status Orders  (From admission, onward)        Start     Ordered   08/11/17 1503  DNR (do not resuscitate)  Continuous    Question Answer Comment  In the event of cardiac or respiratory ARREST Do not call a "code blue"   In the event of cardiac or respiratory ARREST Do not perform Intubation, CPR, defibrillation or ACLS   In the event of cardiac or respiratory ARREST Use medication by any route, position, wound care, and other measures to relive pain and suffering. May use oxygen, suction and manual treatment of airway obstruction as needed for comfort.      08/11/17 1503    Code Status History    Date Active Date Inactive Code Status Order ID Comments User Context   08/05/2017 0257 08/11/2017 1503 Full Code [id number]  Rodena Goldmann, DO ED       Prognosis:   < 2 weeks  Discharge Planning:  Hospice facility  Care plan was discussed with  Patient, niece   Thank you for allowing the Palliative Medicine Team to assist in the care of this patient.   Time In: 10 Time Out: 10.25 Total Time 25 Prolonged Time Billed  no       Greater than 50%  of this time was spent counseling and coordinating care related to the above assessment and plan.  Loistine Chance, MD [phone]  Please contact Palliative Medicine Team phone at [phone] for questions and concerns.

## 2017-08-12 NOTE — Plan of Care (Signed)
Patient being discharged to hospice today via [facility].  Joellen Jersey, RN

## 2017-08-12 NOTE — Progress Notes (Signed)
Patient has now been made DNR and comfort care.  Now off tele and cardiac monitoring.   [facility] will sign off.   Medication Recommendations:  none Other recommendations (labs, testing, etc):  none Follow up as an outpatient:  No followup outpt as patient has been made comfort care

## 2017-08-18 DEATH — deceased

## 2017-09-07 LAB — FUNGAL ORGANISM REFLEX

## 2017-09-07 LAB — FUNGUS CULTURE RESULT

## 2017-09-07 LAB — FUNGUS CULTURE WITH STAIN

## 2017-09-21 LAB — ACID FAST CULTURE WITH REFLEXED SENSITIVITIES (MYCOBACTERIA): Acid Fast Culture: NEGATIVE

## 2017-09-21 LAB — ACID FAST CULTURE WITH REFLEXED SENSITIVITIES

## 2017-09-30 LAB — ACID FAST CULTURE WITH REFLEXED SENSITIVITIES (MYCOBACTERIA): Acid Fast Culture: NEGATIVE

## 2020-03-25 IMAGING — CT CT HEAD W/O CM
3 series · 14 of 47 positions shown, 16 images · non-contrast
Comparison: None.

CLINICAL DATA: Nasal congestion starting a few weeks ago.

EXAM:
CT HEAD WITHOUT CONTRAST
TECHNIQUE: Contiguous axial images were obtained from the base of the skull
through the vertex without intravenous contrast.

[Series 2: head trauma wo · axial · 0.43mm/px · z∈[+1162,+1287]mm · 8 of 31 slices shown, 10 images]
[im 3/31  brain]
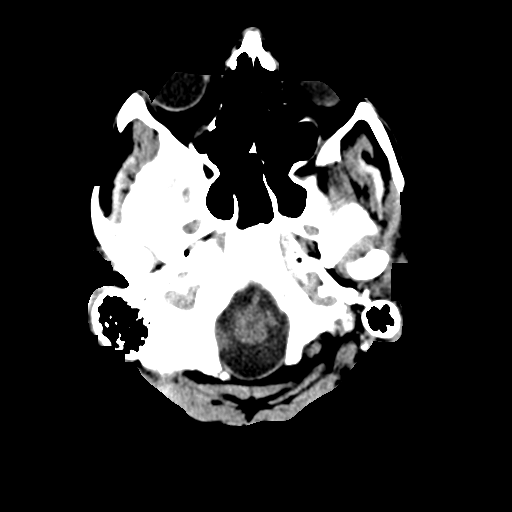
[im 3/31  bone]
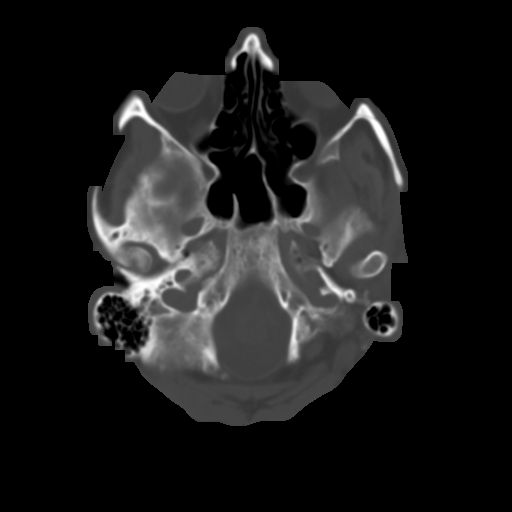
[im 7/31  brain]
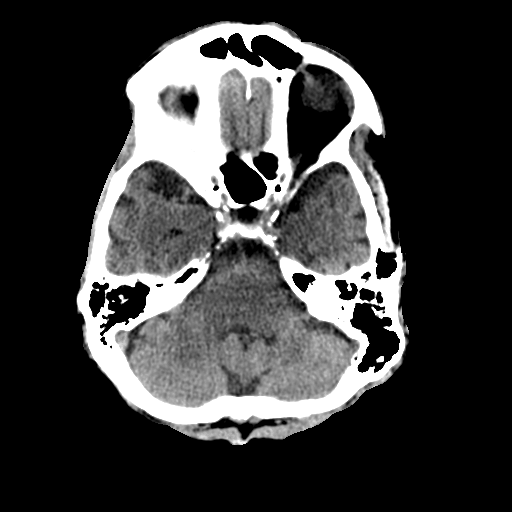
[im 10/31  brain]
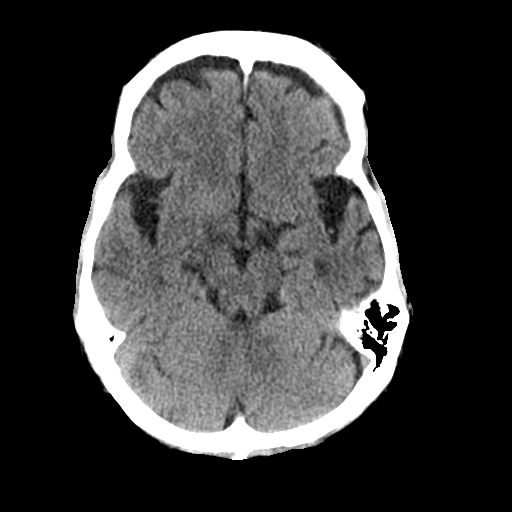
[im 14/31  brain]
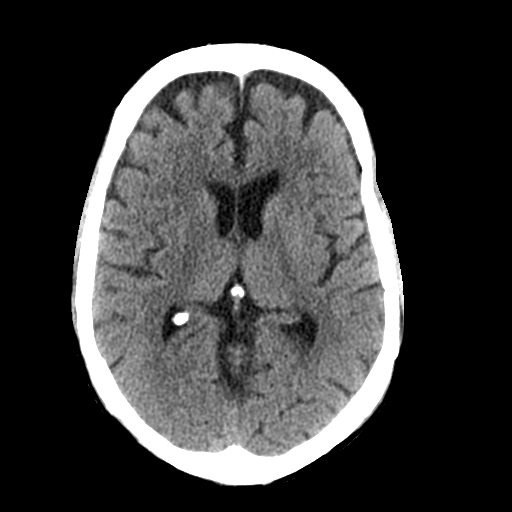
[im 17/31  brain]
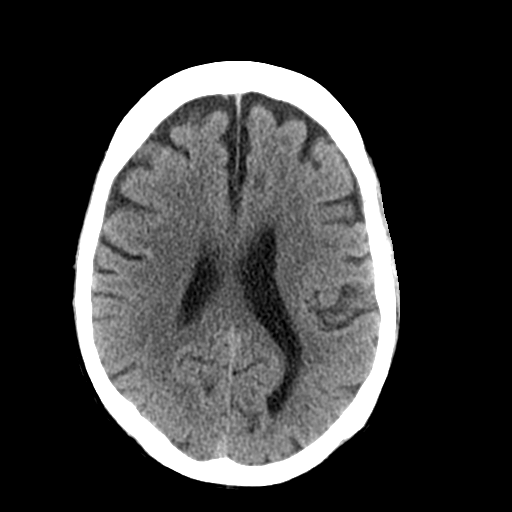
[im 17/31  bone]
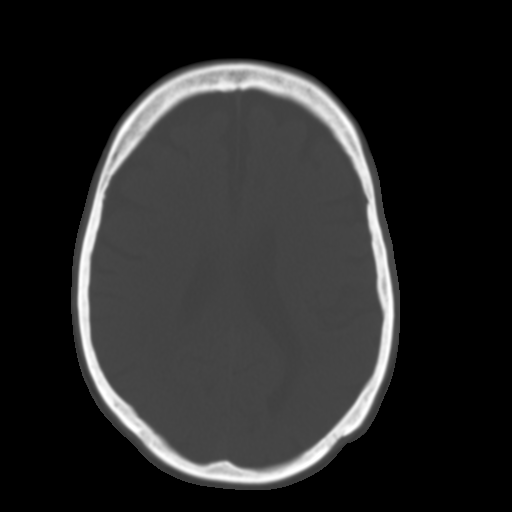
[im 21/31  brain]
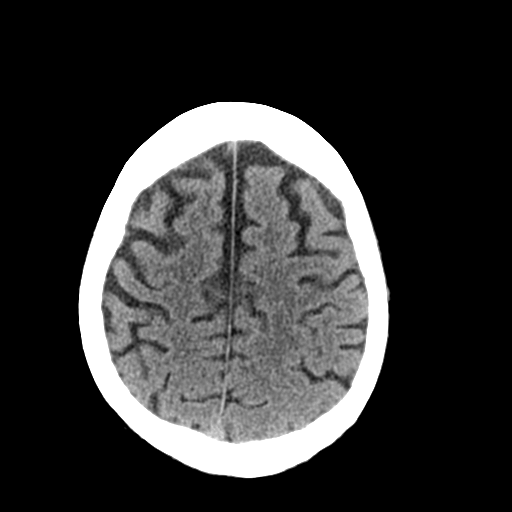
[im 24/31  brain]
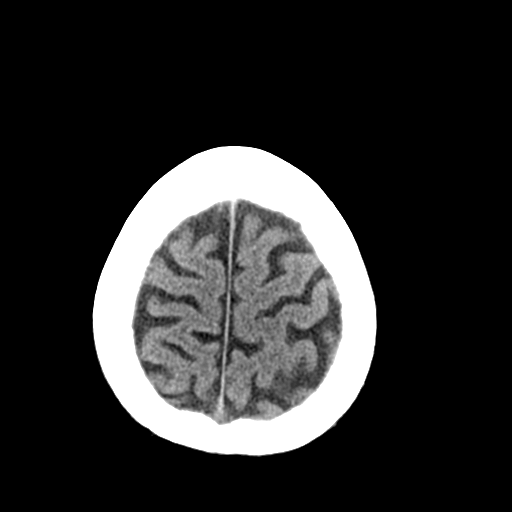
[im 28/31  brain]
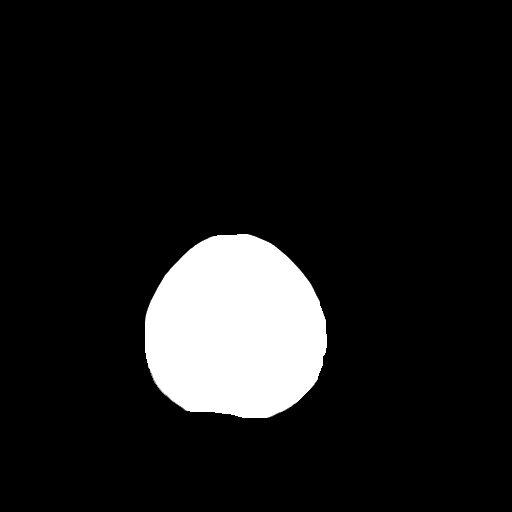

[Series 4: coronal soft tissue · coronal · 0.28mm/px · 3 of 62 slices shown]
[im 21/62  brain]
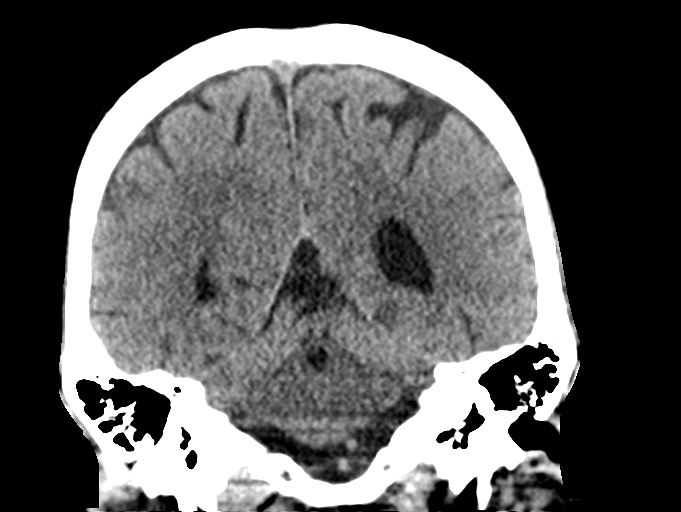
[im 28/62  brain]
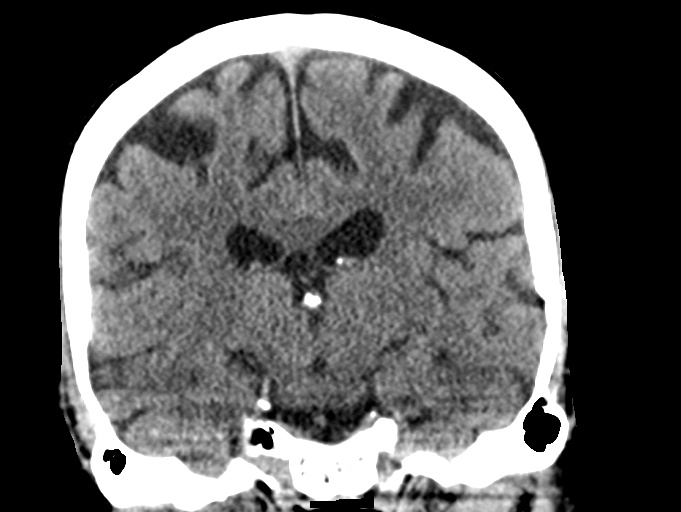
[im 34/62  brain]
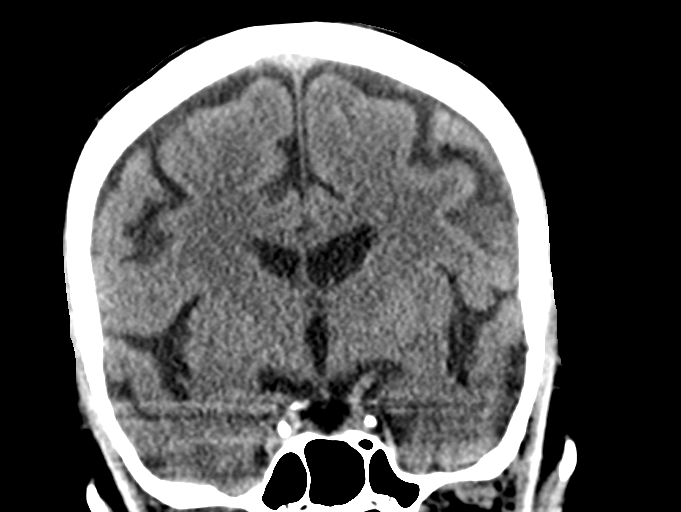

[Series 5: sagittal soft tissue · sagittal · 0.27mm/px · 3 of 48 slices shown]
[im 16/48  brain]
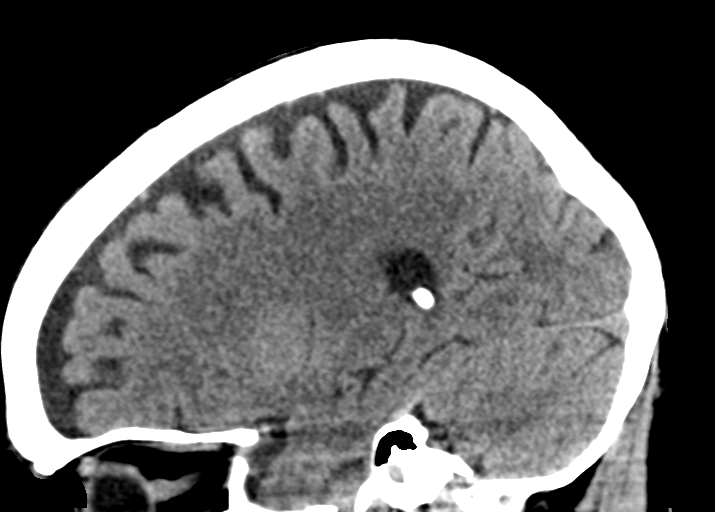
[im 24/48  brain]
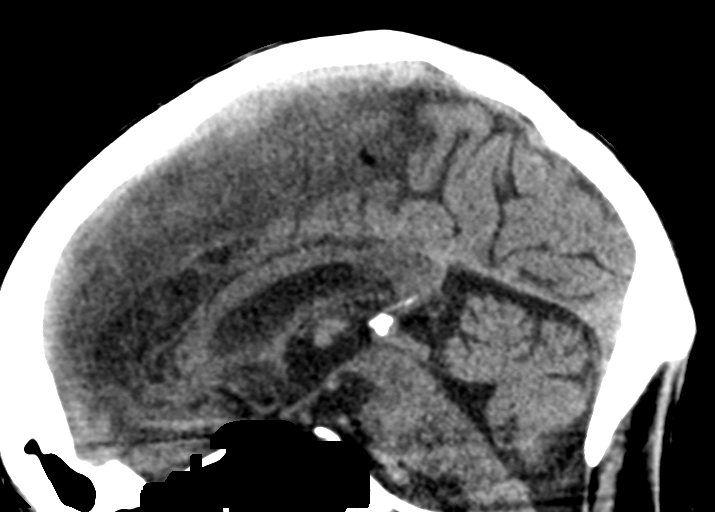
[im 32/48  brain]
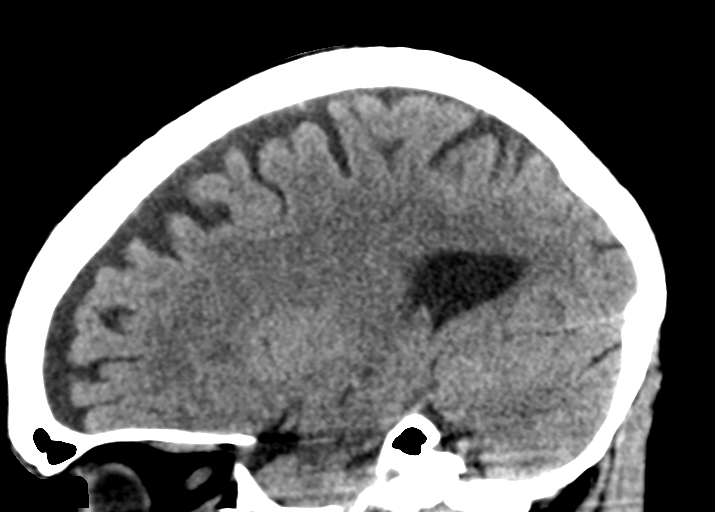

[14 of 47 positions shown; findings below may reference images not displayed]

FINDINGS: Brain: Mild age related involutional changes the brain with chronic
appearing small vessel ischemic disease periventricular white
matter. No large vascular territory infarct, hemorrhage, intra-axial
mass nor extra-axial fluid collections. Midline fourth ventricle and
basal cisterns without effacement. No hydrocephalus. Brainstem and
cerebellum are nonacute.

Vascular: No hyperdense vessel sign.

Skull: Normal. Negative for fracture or focal lesion.

Sinuses/Orbits: Clear paranasal sinuses and mastoids without
significant mucosal thickening. Intact orbits and globes.

Other: None
IMPRESSION: Age related involutional changes of the brain with likely chronic
small vessel ischemic disease. No acute intracranial abnormality.
The included paranasal sinuses are clear.

## 2020-03-26 IMAGING — DX DG CHEST 1V
1 series · 1 of 1 positions shown · non-contrast
Comparison: CT chest 08/05/2017, chest radiograph 08/04/2017

CLINICAL DATA: BILATERAL pleural effusions and pericardial
effusion, post diagnostic LEFT thoracentesis

EXAM:
CHEST  1 VIEW

[chest ap]
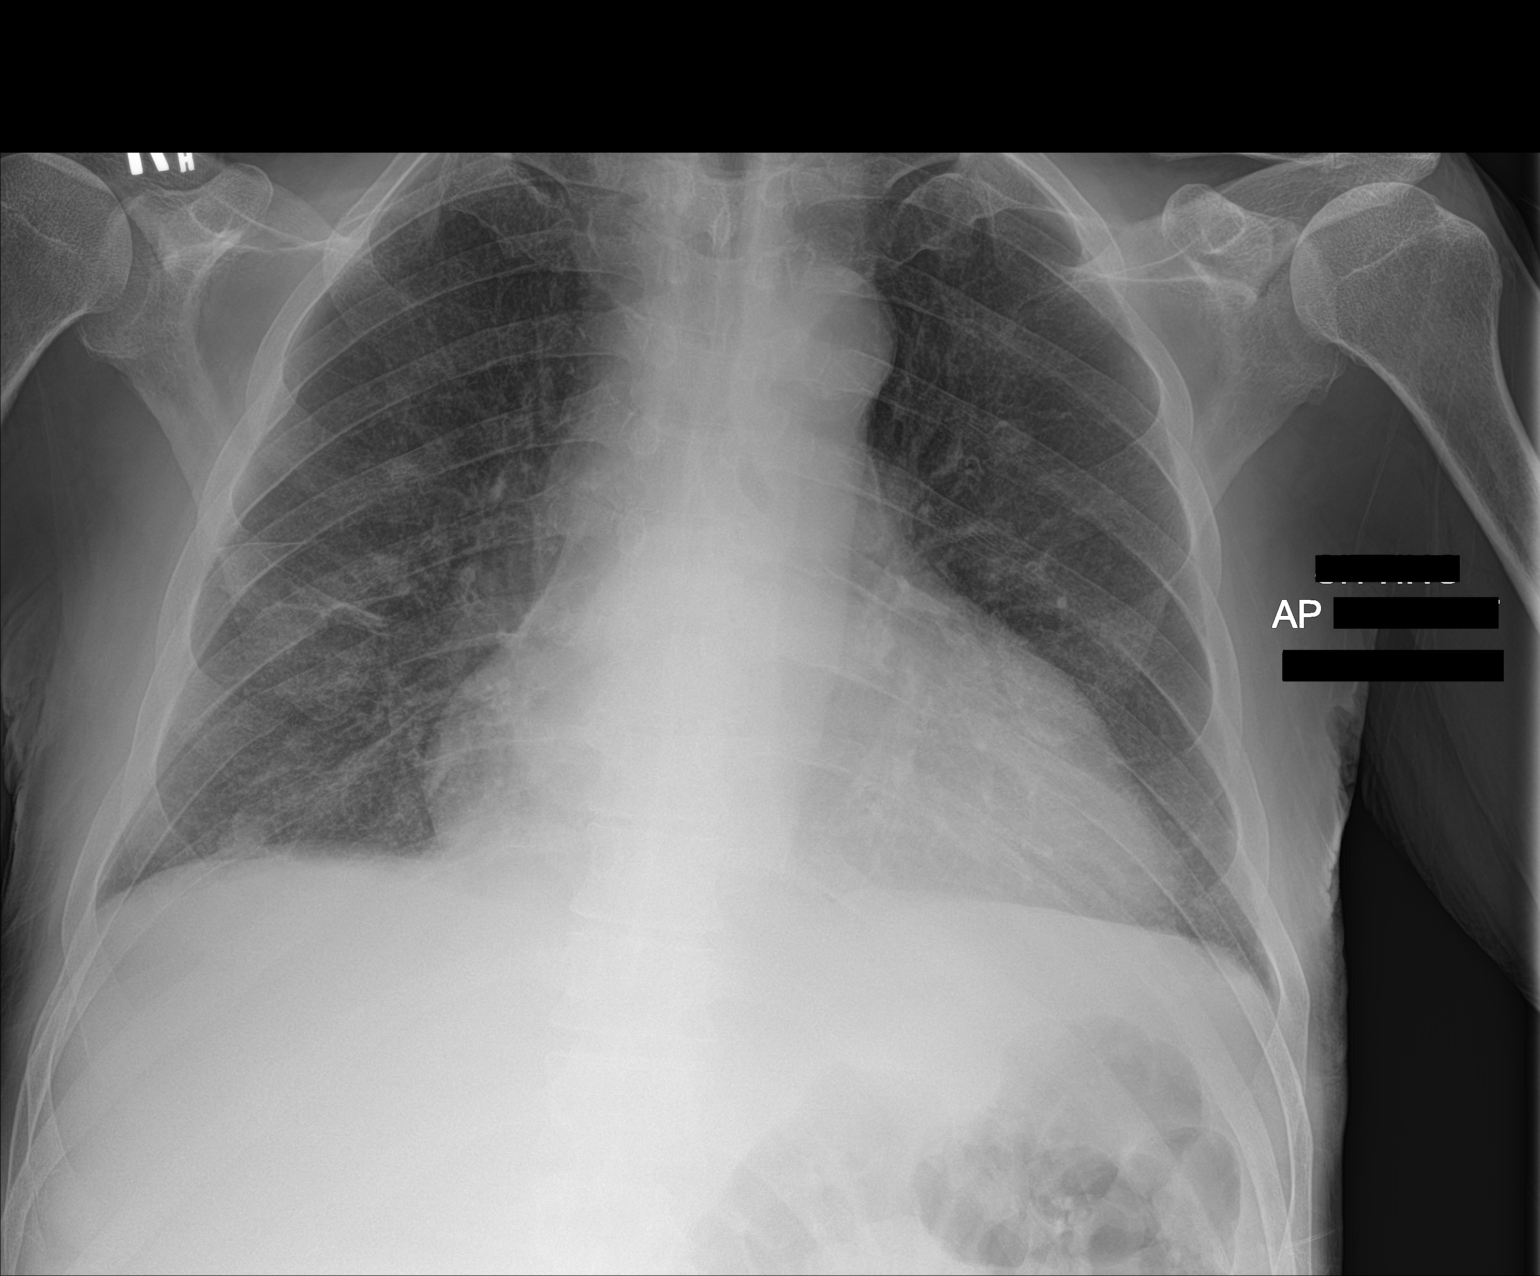

[1 of 1 positions shown; findings below may reference images not displayed]

FINDINGS: Enlargement of cardiac silhouette, known pericardial effusion by CT.

Mediastinal contours and pulmonary vascularity normal.

Probable bibasilar effusions.

No pneumothorax following LEFT thoracentesis.

Upper lungs clear.

Mild atelectasis versus infiltrate at RIGHT base.

Probable RIGHT nipple shadow.

Bones demineralized.
IMPRESSION: No pneumothorax following LEFT thoracentesis.
